# Patient Record
Sex: Female | Born: 1960 | Race: White | Hispanic: No | Marital: Single | Smoking: Never smoker
Health system: Southern US, Academic
[De-identification: ages and names within clinical notes are randomized; demographics above are authoritative.]

## PROBLEM LIST (undated history)

## (undated) DIAGNOSIS — K219 Gastro-esophageal reflux disease without esophagitis: Secondary | ICD-10-CM

## (undated) DIAGNOSIS — E079 Disorder of thyroid, unspecified: Secondary | ICD-10-CM

## (undated) DIAGNOSIS — C801 Malignant (primary) neoplasm, unspecified: Secondary | ICD-10-CM

## (undated) DIAGNOSIS — M8500 Fibrous dysplasia (monostotic), unspecified site: Secondary | ICD-10-CM

## (undated) DIAGNOSIS — R519 Headache, unspecified: Secondary | ICD-10-CM

## (undated) DIAGNOSIS — F32A Depression, unspecified: Secondary | ICD-10-CM

## (undated) DIAGNOSIS — Z85828 Personal history of other malignant neoplasm of skin: Secondary | ICD-10-CM

## (undated) DIAGNOSIS — E785 Hyperlipidemia, unspecified: Secondary | ICD-10-CM

## (undated) DIAGNOSIS — N649 Disorder of breast, unspecified: Secondary | ICD-10-CM

## (undated) HISTORY — DX: Depression, unspecified: F32.A

## (undated) HISTORY — PX: FACIAL COSMETIC SURGERY: SHX629

## (undated) HISTORY — DX: Hyperlipidemia, unspecified: E78.5

## (undated) HISTORY — DX: Headache, unspecified: R51.9

## (undated) HISTORY — PX: HX BREAST BIOPSY: SHX20

## (undated) HISTORY — DX: Disorder of thyroid, unspecified: E07.9

## (undated) HISTORY — DX: Malignant (primary) neoplasm, unspecified (CMS HCC): C80.1

## (undated) HISTORY — DX: Gastro-esophageal reflux disease without esophagitis: K21.9

## (undated) HISTORY — DX: Personal history of other malignant neoplasm of skin: Z85.828

## (undated) HISTORY — PX: HX HYSTERECTOMY: SHX81

## (undated) HISTORY — DX: Fibrous dysplasia (monostotic), unspecified site: M85.00

## (undated) HISTORY — DX: Disorder of breast, unspecified: N64.9

## (undated) NOTE — Result Encounter Note (Signed)
 Formatting of this note might be different from the original.  Please inform pt that her screening mammogram is negative for any suspicious changes or lesions. PLease remind her to come in if she notices any breast changes.     Electronically signed by Ronal Clause, APRN,FNP-BC at 10/19/2023  5:27 PM EDT

## (undated) NOTE — Result Encounter Note (Signed)
Formatting of this note might be different from the original.  HI Dominique Becker,     All of your labs look great including your cholesterol levels which are very well controlled on the low dose of crestor and came down a large amount compared to previous so I want you to continue all of your current medications at this time.    Please let me know if you have any further questions about your results    Sincerely,   Dr. Natividad Brood  Electronically signed by Marjorie Smolder, MD at 01/04/2022  3:07 PM EDT

## (undated) NOTE — Progress Notes (Signed)
Formatting of this note is different from the original.  Subjective   Patient ID: Dominique Becker is a 55 y.o. female who presents for Cough (- Pt presents with cough / congestion, sinus pressure / pain, abd pain, HA, fever X1 week. ) and Fever.    Patient presents with c/o cough, HA, fatigue, fever and somnolence since Saturday. States she has been sleeping all week which is unusual for her. C/o nausea. Patient in no acute distress on exam, resps even and unlabored, pink, warm, dry.     History provided by:  Patient  Cough  This is a new problem. The current episode started in the past 7 days. The problem has been gradually worsening. The problem occurs constantly. The cough is Non-productive. Associated symptoms include chills, a fever, headaches, myalgias, nasal congestion and rhinorrhea. Pertinent negatives include no chest pain, ear pain, hemoptysis, sore throat, shortness of breath or wheezing. Nothing aggravates the symptoms. She has tried OTC cough suppressant and rest for the symptoms. The treatment provided no relief.   Fever   This is a new problem. The current episode started in the past 7 days. The problem occurs 2 to 4 times per day. The problem has been unchanged. The maximum temperature noted was 100 to 100.9 F. The temperature was taken using an oral thermometer. Associated symptoms include congestion, coughing, headaches, muscle aches and nausea. Pertinent negatives include no abdominal pain, chest pain, diarrhea, ear pain, sore throat, vomiting or wheezing. She has tried acetaminophen and NSAIDs for the symptoms. The treatment provided moderate relief.     Review of Systems   Constitutional:  Positive for chills and fever. Negative for activity change, appetite change and fatigue.   HENT:  Positive for congestion, rhinorrhea, sinus pressure, sinus pain and sneezing. Negative for ear pain and sore throat.    Eyes:  Negative for pain.   Respiratory:  Positive for cough and chest tightness. Negative  for hemoptysis, shortness of breath and wheezing.    Cardiovascular:  Negative for chest pain and palpitations.   Gastrointestinal:  Positive for nausea. Negative for abdominal pain, constipation, diarrhea and vomiting.   Musculoskeletal:  Positive for myalgias. Negative for arthralgias and gait problem.   Skin:  Negative for color change.   Neurological:  Positive for headaches. Negative for dizziness, syncope and light-headedness.   Hematological:  Negative for adenopathy.     Objective   Pulse 91   Temp 99.9 F (37.7 C) (Tympanic)   Ht 5\' 4"  (1.626 m)   Wt 247 lb (112 kg)   SpO2 96%   BMI 42.40 kg/m   Physical Exam  Vitals and nursing note reviewed.   Constitutional:       General: She is not in acute distress.     Appearance: Normal appearance. She is ill-appearing.   HENT:      Head: Normocephalic.      Right Ear: Tympanic membrane and ear canal normal.      Left Ear: Tympanic membrane and ear canal normal.      Nose: Nose normal.   Eyes:      Extraocular Movements: Extraocular movements intact.      Pupils: Pupils are equal, round, and reactive to light.   Cardiovascular:      Rate and Rhythm: Normal rate and regular rhythm.      Heart sounds: Normal heart sounds.   Pulmonary:      Effort: Pulmonary effort is normal.      Breath sounds:  Normal breath sounds and air entry. No decreased breath sounds, wheezing, rhonchi or rales.   Chest:      Chest wall: No tenderness.   Abdominal:      General: Bowel sounds are normal.   Musculoskeletal:         General: Normal range of motion.      Cervical back: Full passive range of motion without pain, normal range of motion and neck supple. No rigidity.   Skin:     General: Skin is warm and dry.      Capillary Refill: Capillary refill takes less than 2 seconds.   Neurological:      General: No focal deficit present.      Mental Status: She is alert and oriented to person, place, and time.   Psychiatric:         Attention and Perception: Attention and perception  normal.         Mood and Affect: Mood normal.         Behavior: Behavior is cooperative.     Results for orders placed or performed in visit on 04/14/23   POCT Respiratory Panel manually resulted    Collection Time: 04/14/23 10:33 AM   Result Value Ref Range    Influenza A PCR Positive (A) Negative    Influenza B PCR Negative Negative    POC RSV PCR Negative Negative    SARS-CoV-2 PCR Negative Negative    RHINOVIRUS PCR Negative Negative    Internal QC Passed PASSED      Past Medical History:   Diagnosis Date    Allergic Morphine    Arthritis     Cancer (CMS/HCC) (HCC)     Disease of thyroid gland     GERD (gastroesophageal reflux disease)     History of mammogram 10/13/2020    Note: [facility] [place] BIRADS 2 - benign Repeat in 1 year    History of mammogram 10/14/2021    [facility] BIRADS 2 - benign Repeat in 1 year     Obesity     Varicella     Visual impairment      Family History   Problem Relation Name Age of Onset    Heart attack Father Holley Dexter     Heart disease Father Holley Dexter      Current Outpatient Medications   Medication Sig Dispense Refill    aspirin 81 mg oral EC tablet Take 1 tablet (81 mg) by mouth.      escitalopram 5 mg oral tablet Take 1 tablet (5 mg) by mouth in the morning. 90 tablet 1    famotidine 20 mg oral tablet Take 1 tablet (20 mg) by mouth in the morning and at bedtime. Patient Instructions: 1 po bid as needed 180 tablet 1    omeprazole 20 mg oral DR capsule Take 1 capsule (20 mg) by mouth before breakfast and before evening meal. 180 capsule 1    rosuvastatin 5 mg oral tablet       SYNTHROID 175 mcg oral tablet TAKE 1 TABLET BY MOUTH EVERY MORNING BEFORE BREAKFAST 90 tablet 0    diclofenac sodium 1 % top gel apply 2g over upper extremity joint or 4g over lower extremity joint 2-3 times daily for up to 21 days at a time; do not use on more than 2 body parts at the same time 450 g 1    ezetimibe 10 mg oral tablet Patient Instructions: 1 daily (Patient not taking: Reported on 01/05/2023) 90 tablet  1     nitroglycerin 0.4 mg SL SL tablet        No current facility-administered medications for this visit.     Allergies   Allergen Reactions    Doxycycline     Metronidazole Nausea And Vomiting     Other reaction(s): Nausea, Vomiting, Diarrhea    Morphine Sulfate Nausea And Vomiting     Other reaction(s): Nausea, Vomiting, Diarrhea, headache, Nausea, Vomiting, Diarrhea, headache    Vancomycin Hcl      Other reaction(s): itching     Social History     Socioeconomic History    Marital status: Single     Spouse name: Not on file    Number of children: Not on file    Years of education: Not on file    Highest education level: Not on file   Occupational History    Not on file   Tobacco Use    Smoking status: Never    Smokeless tobacco: Never   Substance and Sexual Activity    Alcohol use: Never    Drug use: Never    Sexual activity: Not on file   Other Topics Concern    Not on file   Social History Narrative    Not on file     Social Drivers of Health     Financial Resource Strain: Low Risk  (06/24/2021)    Overall Financial Resource Strain (CARDIA)     Difficulty of Paying Living Expenses: Not hard at all   Food Insecurity: No Food Insecurity (06/24/2021)    Hunger Vital Sign     Worried About Running Out of Food in the Last Year: Never true     Ran Out of Food in the Last Year: Never true   Transportation Needs: No Transportation Needs (06/24/2021)    PRAPARE - Therapist, art (Medical): No     Lack of Transportation (Non-Medical): No   Physical Activity: Inactive (06/24/2021)    Exercise Vital Sign     Days of Exercise per Week: 0 days     Minutes of Exercise per Session: 0 min   Stress: Not on file   Social Connections: Not on file   Intimate Partner Violence: Not on file   Housing Stability: High Risk (07/06/2021)    Housing Stability Vital Sign     Unable to Pay for Housing in the Last Year: Yes     Number of Places Lived in the Last Year: 1     Unstable Housing in the Last Year: Yes     Patient  Active Problem List   Diagnosis    Chronic sinusitis    Disorder of globe    Diverticular disease    Polyostotic fibrous dysplasia    Hyperlipidemia    Hypothyroidism    Keratoconjunctivitis of both eyes    Rosacea    Colon cancer screening    PVC (premature ventricular contraction)    Anxiety and depression    Gastroesophageal reflux disease without esophagitis    Primary osteoarthritis of right knee    Prediabetes    Allergic rhinitis    History of mammogram    Basal cell adenocarcinoma    Basal cell carcinoma of nose    Chronic cough    Dryness of both eyes due to decreased tear production    Dry eyes, bilateral    Superior limbic keratoconjunctivitis    Dyslipidemia    Fatigue    Floaters, unspecified  laterality    Hypersomnia    Eyelid retraction or lag    Lid retraction    NSVT (nonsustained ventricular tachycardia) (HCC)    Snoring         Assessment/Plan   Visit Diagnoses    Diagnosis ICD-10-CM Order   1. Influenza A with respiratory manifestations  J10.1 oseltamivir 75 mg oral capsule     2. Acute cough  R05.1 POCT Respiratory Panel manually resulted     promethazine-DM 6.25-15 mg/5 mL oral syrup     -Positive for Influenza A- Take medications as prescribed.  Monitor Fluid intake and output.  Fluids frequently.  OTC meds as needed for fever and pain.  Follow up with PCP if no improvement in 5-7 days.       *Voice dictation software was used to generate portions of this note.  If there are any errors or illegible content, please notify me for correction.   Electronically signed by Consuela Mimes, FNP-BC at 04/14/2023 11:01 AM EST

## (undated) NOTE — Progress Notes (Signed)
Formatting of this note is different from the original.  Patient Active Problem List   Diagnosis    Chronic sinusitis    Disorder of globe    Diverticular disease    Polyostotic fibrous dysplasia    Hyperlipidemia    Hypothyroidism    Keratoconjunctivitis of both eyes    Rosacea    Colon cancer screening    PVC (premature ventricular contraction)    Anxiety and depression    Gastroesophageal reflux disease without esophagitis    Primary osteoarthritis of right knee    History of mammogram     Subjective   Patient ID: Dominique Becker is a 6 y.o. female who presents for Hypothyroidism (States that she thinks 131mg of synthroid isn't enough. States she feels bad on lower dose), Hyperlipidemia (States that the cholesterol medication makes her feel bad, has to take it at night), and Cough (States has drainage and cough x2-3 months. Has taken OTC meds without relief. Started on Zyrtec with no relief.)    HPI  Presents for follow-up    Nasal drainage and cough x 2-3 months  Has tried OTC zyrtec and cough/cold medications but sx are persisting   +mild sinus pressure    PVCs:  remains off all medications at this time and continues following regularly with EP/cardiology  cardiology Dr SDelia Chimes(10/2021) -checking stress test and sleep study; recommended to resume crestor '5mg'$  daily   No new s/s noted  holter monitor (01/2020) SVT, PVCs ; >50k PVCs noted    evaluated by EP (03/2020) -TTE (04/2020) wnl; EF 55-60%; wore another monitor and told had further ectopy but recommended continued monitoring unless sx worsen     anxiety and depression:  on lexapro '10mg'$  every day -states taking regularly and sx have been well controlled so stopped taking wellbutrin >1 year ago  Ongoing personal stressors -Father passed away after getting covid several months ago; mom living now [facility] -recently with broken hip s/p surgery and rehab  (initially tried zoloft but couldn't tolerate due to n/v; didn't tolerate prozac  previously)     GERD: on omeprazole '20mg'$  BID and famotidine '20mg'$  BID prn -states taking omeprazole as rx and sx remain well controlled  Only needing famotidine occasionally  past h/o stomach ulcers -multiple several years ago     Hypothyroidism: on synthroid dec to 1531m daily (06/2021) but states has been taking 175 mcg daily for the past few months bc feeling so poorly on the 15040m-states feels better on 175m69maily  Had thyroid levels completed last month by Dr StanDelia Chimes told thyroid levels are normal on the 175mc54mse  +9lb weight gain since (06/2021)    HLD: on zetia '10mg'$  daily  -states taking regularly and tolerating well  Per cardiology Dr StantDelia Chimes023) -resumed crestor '5mg'$  daily as well -states tolerating well  -declines statin bc worried about side effects        Past conditions being followed, not addressed today:  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   follows at [facility]: on lotemax eye gel BID -frequent tearing of eyes   CT sinus (11/2018) stable post op changes  Repeat CT sinus (12/2020) -stable post op changes   ongoing poor vision on L -following with ophthalmology regularly  -continues using OTC antihistamines and nasal saline sprays prn   tessalon helps as well     rosacea: no longer on doxycycline '100mg'$  daily  as broke out in hives  also  on soolantra cream by dermatology Dr Deirdre Pippins    R knee pain on/off the past 2 years -direct fall on knee 2 years ago; never sought care due to [place] at the time   established with ortho (2022) -received injections with improvement  on ibuprofen and voltaren gel prn     abnormal mammogram 08/2016 -probably benign but recommended 33mof/u -repeat mammogram (09/2017) wnl; repeat 1 year   breast lumps removed 03/2017; repeat mammogram 09/2017 benign   repeat mammogram (09/2019) wnl   underwent genetic testing for cancer risk -continues following [facility] breast surgeon [facility]   Repeat mammogram (09/2021) wnl;  repeat 1 year    Current Outpatient Medications:     aspirin 81 mg oral EC tablet, Take 1 tablet (81 mg) by mouth., Disp: , Rfl:     cetirizine 10 mg oral tablet, Take 1 tablet (10 mg) by mouth., Disp: , Rfl:     diclofenac sodium 1 % top gel, apply 2g over upper extremity joint or 4g over lower extremity joint 2-3 times daily for up to 21 days at a time; do not use on more than 2 body parts at the same time, Disp: 100 g, Rfl: 1    escitalopram 10 mg oral tablet, Patient Instructions: 1 tab QD, Disp: 90 tablet, Rfl: 1    ezetimibe 10 mg oral tablet, Patient Instructions: 1 daily, Disp: 90 tablet, Rfl: 1    famotidine 20 mg oral tablet, Patient Instructions: 1 po bid as needed, Disp: 180 tablet, Rfl: 1    fluorometholone 0.1 % opht ophthalmic suspension, , Disp: , Rfl:     nitroglycerin 0.4 mg SL SL tablet, , Disp: , Rfl:     omeprazole 20 mg oral DR capsule, Take 1 capsule (20 mg) by mouth before breakfast and before evening meal., Disp: 180 capsule, Rfl: 1    rosuvastatin 10 mg oral tablet, Take 1 tablet (10 mg) by mouth in the morning., Disp: 90 tablet, Rfl: 1    amoxicillin-pot clavulanate 875-125 mg oral tablet, Take 1 tablet by mouth in the morning and at bedtime for 10 days., Disp: 20 tablet, Rfl: 0    SYNTHROID 175 mcg oral tablet, Take 1 tablet (175 mcg) by mouth before breakfast., Disp: 90 tablet, Rfl: 1    Allergies   Allergen Reactions    Doxycycline     Metronidazole Nausea And Vomiting     Other reaction(s): Nausea, Vomiting, Diarrhea    Morphine Sulfate Nausea And Vomiting     Other reaction(s): Nausea, Vomiting, Diarrhea, headache, Nausea, Vomiting, Diarrhea, headache    Rosuvastatin     Vancomycin Hcl      Other reaction(s): itching     Review of Systems   Constitutional:  Negative for activity change, appetite change and unexpected weight change.   HENT:  Positive for congestion, rhinorrhea and sinus pressure.    Respiratory:  Positive for cough. Negative for shortness of breath and wheezing.     Cardiovascular:  Negative for chest pain and palpitations.   Gastrointestinal:  Negative for abdominal pain.   Neurological:  Negative for dizziness and headaches.   Psychiatric/Behavioral:  Negative for dysphoric mood and sleep disturbance. The patient is not nervous/anxious.      Objective   BP 136/78 (BP Location: Left arm, Patient Position: Sitting, BP Cuff Size: Adult)   Pulse 83   Ht '5\' 4"'$  (1.626 m)   Wt 247 lb (112 kg)   SpO2 96%  BMI 42.40 kg/m     Physical Exam  Vitals reviewed.   Constitutional:       Appearance: Normal appearance.   HENT:      Nose: Nose normal.      Mouth/Throat:      Mouth: Mucous membranes are moist.      Pharynx: Oropharynx is clear.   Eyes:      Conjunctiva/sclera: Conjunctivae normal.      Pupils: Pupils are equal, round, and reactive to light.   Cardiovascular:      Rate and Rhythm: Normal rate and regular rhythm.      Heart sounds: No murmur heard.  Pulmonary:      Effort: Pulmonary effort is normal.      Breath sounds: Normal breath sounds. No wheezing, rhonchi or rales.   Abdominal:      General: Bowel sounds are normal. There is no distension.      Palpations: Abdomen is soft.   Musculoskeletal:         General: Normal range of motion.      Cervical back: Neck supple.   Skin:     General: Skin is warm and dry.   Neurological:      General: No focal deficit present.      Mental Status: She is alert and oriented to person, place, and time.   Psychiatric:         Mood and Affect: Mood normal.         Thought Content: Thought content normal.     Assessment/Plan   1. PVC (premature ventricular contraction)  Stable currently off all medications -encouraged to continue to keep close follow-up with cardiology  -records requested from recent stress test and sleep study    2. Anxiety and depression  Stable, continue current medication    3. Gastroesophageal reflux disease without esophagitis  Stable, continue current medications    4. Hypothyroidism, unspecified type  Stable on  current dose of synthroid 120mg daily -continue  -repeat labs ordered    - TSH  - T4, free  - SYNTHROID 175 mcg oral tablet; Take 1 tablet (175 mcg) by mouth before breakfast.  Dispense: 90 tablet; Refill: 1    5. Hyperlipidemia, unspecified hyperlipidemia type  Stable, continue current medications -tolerate '5mg'$  of crestor currently so will continue at this time (unable to tolerate higher dose); continue zetia as rx  Repeat labs ordered     - Comprehensive metabolic panel  - Lipid panel    6. Other long term (current) drug therapy  Labs ordered  - CBC auto differential    7. Acute non-recurrent sinusitis, unspecified location  Rx sent for augmentin x 10 days  -encouraged to continue symptomatic measures and seek care if sx persist or worsen    - amoxicillin-pot clavulanate 875-125 mg oral tablet; Take 1 tablet by mouth in the morning and at bedtime for 10 days.  Dispense: 20 tablet; Refill: 0    RTC 6 months or advised to return sooner if needed    [place]  Fasting glucose/A1c  repeat ordered  Pap smear reports UTD gyn 09/2018  Mammogram (09/2021) wnl; repeat 1 year  Colonoscopy cologuard neg (06/2020)  DEXA (09/2020) wnl  Tetanus will discuss  Influenza discussed previously-pt declined  Pneumococcal/Prevnar n/a  Shingrix 1st dose (12/2020) -2nd dose given today (06/2021)  COVID 3doses 2021  Hepatitis C screening will discuss  Lung cancer screening n/a  Dietary/Exercise Intervention: will continue  to encourage healthy diet and daily physical activity.   Electronically signed by Marjorie Smolder, MD at 01/04/2022  9:02 AM EDT

## (undated) NOTE — Progress Notes (Signed)
 Formatting of this note is different from the original.  Chief Complaint   Patient presents with    Nausea     Pt. Presents with nausea, vertigo, and weakness- ongoing 1 week. Urine dipstick collected. PT states she had blood work and xray done at [facility] a few days ago. Pt states her friend has flu and she would like to be tested to rule this out. Additionally, pt is concerned she is not getting enough fluids as she is unable to keep anything down.      Subjective     Dominique Becker is a 18 y.o. female who presents with the above chief complaint.    PT WENT TO ER, HAD LABWORK CXR WAS TOLD EVERYTHING WAS GOOD, JUST TO TAKE ZOFRAN, PT IS STILL UNABLE TO KEEP ANYTHING DOWN.     Patient Active Problem List   Diagnosis    Chronic sinusitis    Disorder of globe    Diverticular disease    Polyostotic fibrous dysplasia    Hyperlipidemia    Hypothyroidism    Keratoconjunctivitis of both eyes    Rosacea    Colon cancer screening    PVC (premature ventricular contraction)    Anxiety and depression    Gastroesophageal reflux disease without esophagitis    Primary osteoarthritis of right knee    Prediabetes    Allergic rhinitis    History of mammogram    Basal cell adenocarcinoma    Basal cell carcinoma of nose    Chronic cough    Dryness of both eyes due to decreased tear production    Dry eyes, bilateral    Superior limbic keratoconjunctivitis    Dyslipidemia    Fatigue    Floaters, unspecified laterality    Hypersomnia    Eyelid retraction or lag    Lid retraction    NSVT (nonsustained ventricular tachycardia) (HCC)    Snoring     Past Medical History:   Diagnosis Date    Allergic Morphine    Arthritis     Cancer (CMS/HCC) (HCC)     Disease of thyroid gland     GERD (gastroesophageal reflux disease)     History of mammogram 10/13/2020    Note: [place] BIRADS 2 - benign Repeat in 1 year    History of mammogram 10/14/2021    [place] BIRADS 2 - benign Repeat in 1 year     Obesity     Varicella     Visual impairment      Current  Outpatient Medications:     aspirin 81 mg oral EC tablet, Take 1 tablet (81 mg) by mouth., Disp: , Rfl:     diclofenac sodium 1 % top gel, apply 2g over upper extremity joint or 4g over lower extremity joint 2-3 times daily for up to 21 days at a time; do not use on more than 2 body parts at the same time, Disp: 450 g, Rfl: 1    escitalopram 5 mg oral tablet, Take 1 tablet (5 mg) by mouth in the morning., Disp: 90 tablet, Rfl: 1    ezetimibe 10 mg oral tablet, Patient Instructions: 1 daily (Patient not taking: Reported on 01/05/2023), Disp: 90 tablet, Rfl: 1    famotidine 20 mg oral tablet, Take 1 tablet (20 mg) by mouth in the morning and at bedtime. Patient Instructions: 1 po bid as needed, Disp: 180 tablet, Rfl: 1    nitroglycerin 0.4 mg SL SL tablet, , Disp: , Rfl:  omeprazole 20 mg oral DR capsule, Take 1 capsule (20 mg) by mouth before breakfast and before evening meal., Disp: 180 capsule, Rfl: 1    promethazine-DM 6.25-15 mg/5 mL oral syrup, Take 5 mL by mouth every 4 (four) hours if needed for cough for up to 7 days., Disp: 118 mL, Rfl: 0    rosuvastatin 5 mg oral tablet, , Disp: , Rfl:     SYNTHROID 175 mcg oral tablet, TAKE 1 TABLET BY MOUTH EVERY MORNING BEFORE BREAKFAST, Disp: 90 tablet, Rfl: 0    Allergies:   Allergies   Allergen Reactions    Doxycycline     Metronidazole Nausea And Vomiting     Other reaction(s): Nausea, Vomiting, Diarrhea    Morphine Sulfate Nausea And Vomiting     Other reaction(s): Nausea, Vomiting, Diarrhea, headache, Nausea, Vomiting, Diarrhea, headache    Vancomycin Hcl      Other reaction(s): itching     Review of Systems   Constitutional:  Negative for activity change, appetite change, chills, fatigue and fever.   Eyes:  Negative for pain.   Respiratory:  Negative for shortness of breath and wheezing.    Cardiovascular:  Negative for chest pain and palpitations.   Gastrointestinal:  Positive for nausea and vomiting. Negative for abdominal pain, constipation and diarrhea.    Musculoskeletal:  Negative for arthralgias, gait problem and myalgias.   Skin:  Negative for color change.   Neurological:  Positive for weakness. Negative for dizziness, syncope, light-headedness and headaches.   Hematological:  Negative for adenopathy.       Objective   Visit Vitals  BP 124/84   Pulse 82   Temp 98.2 F (36.8 C)   Wt 242 lb 12.8 oz (110 kg)   SpO2 98%   BMI 41.68 kg/m   Smoking Status Never   BSA 2.23 m     Physical Exam  Vitals and nursing note reviewed.   Constitutional:       Appearance: Normal appearance.   HENT:      Head: Normocephalic.      Right Ear: Tympanic membrane and ear canal normal.      Left Ear: Tympanic membrane and ear canal normal.      Nose: Nose normal.   Eyes:      Extraocular Movements: Extraocular movements intact.      Pupils: Pupils are equal, round, and reactive to light.   Cardiovascular:      Rate and Rhythm: Normal rate and regular rhythm.      Heart sounds: Normal heart sounds.   Pulmonary:      Effort: Pulmonary effort is normal.      Breath sounds: Normal breath sounds and air entry.   Abdominal:      General: Bowel sounds are normal.      Tenderness: There is abdominal tenderness. There is no right CVA tenderness, left CVA tenderness, guarding or rebound.   Musculoskeletal:         General: Normal range of motion.      Cervical back: Full passive range of motion without pain, normal range of motion and neck supple.   Skin:     General: Skin is warm and dry.      Capillary Refill: Capillary refill takes less than 2 seconds.   Neurological:      General: No focal deficit present.      Mental Status: She is alert and oriented to person, place, and time.   Psychiatric:  Attention and Perception: Attention and perception normal.         Mood and Affect: Mood normal.         Behavior: Behavior is cooperative.     Results for orders placed or performed in visit on 04/20/23   POCT Urinalysis Dipstick manually resulted    Collection Time: 04/20/23 12:01 PM    Result Value Ref Range    Color, UA Yellow Colorless, Yellow, Straw, Xanthochromic, Light Yellow    Clarity, UA Clear Clear    Glucose, UA Negative Negative    Bilirubin, UA Negative Negative    Ketones, UA Negative Negative    Spec Grav, UA 1.020 1.005 - 1.025    Blood, UA Negative Negative    pH, UA 7.0 5.0 - 7.5    Protein, UA Negative Negative    Urobilinogen, UA 1.0 0.2, 1.0 eu/dL    Nitrite, UA Negative Negative    Leukocytes, UA Negative Negative   POCT Respiratory Panel manually resulted    Collection Time: 04/20/23 12:17 PM   Result Value Ref Range    Influenza A PCR Negative Negative    Influenza B PCR Negative Negative    POC RSV PCR Negative Negative    SARS-CoV-2 PCR Negative Negative    RHINOVIRUS PCR Negative Negative    Internal QC Passed PASSED      Assessment/Plan   Visit Diagnoses    Diagnosis ICD-10-CM Order   1. Nausea and vomiting, unspecified vomiting type  R11.2 POCT Urinalysis Dipstick manually resulted     promethazine (Phenergan) injection 12.5 mg     promethazine 25 mg rect suppository     CANCELED: UA Dipstick     2. Acute cough  R05.1 POCT Respiratory Panel manually resulted       TAKE MEDICATION AS PRESCRIBED. LIGHT SMALL MEALS, INCREASE FLUIDS. IF NO IMPROVEMENT IN 2-3 DAYS MAY RETURN TO CLINIC OR GO TO ER       Electronically signed by Oda Cogan, FNP-C at 04/20/2023 12:18 PM EST

## (undated) NOTE — Progress Notes (Signed)
Formatting of this note might be different from the original.  Patient identified via [place] for med-adherence at-risk regarding Rosuvastatin 10 mg. Patient reports medication made her very ill and she self-stopped this medication. *If clinically appropriate please consider alternative statin therapy for CVD risk reduction such as Pravastatin 80 mg daily.   Electronically signed by Zorita Pang, PharmD at 11/19/2021  2:22 PM EDT

## (undated) NOTE — Result Encounter Note (Signed)
Formatting of this note might be different from the original.  Please update caregap reminder    Electronically signed by Berlin Hun, MD at 11/09/2022 12:25 PM EDT

## (undated) NOTE — Progress Notes (Signed)
Formatting of this note might be different from the original.  Spoke with pt.  States she had cardiac testing that did "not go well".  Yesterday.  Cardiologist would like her to continue on the Crestor at 5 mg. At this time.  Pt states she will continue this for two weeks and should she still notice side effects, will contact our office to change to rosuvastatin.  Will contact us in two weeks.  Electronically signed by Shelle Iron, LPN at 48/18/5909  3:11 AM EDT

---

## 2016-09-21 DIAGNOSIS — R922 Inconclusive mammogram: Secondary | ICD-10-CM

## 2017-03-28 DIAGNOSIS — R922 Inconclusive mammogram: Secondary | ICD-10-CM

## 2017-03-28 DIAGNOSIS — R928 Other abnormal and inconclusive findings on diagnostic imaging of breast: Secondary | ICD-10-CM

## 2017-09-16 ENCOUNTER — Encounter (INDEPENDENT_AMBULATORY_CARE_PROVIDER_SITE_OTHER): Payer: Self-pay

## 2017-09-21 ENCOUNTER — Ambulatory Visit (INDEPENDENT_AMBULATORY_CARE_PROVIDER_SITE_OTHER): Payer: Self-pay

## 2017-09-22 ENCOUNTER — Encounter (INDEPENDENT_AMBULATORY_CARE_PROVIDER_SITE_OTHER): Payer: Self-pay

## 2017-09-22 ENCOUNTER — Ambulatory Visit (INDEPENDENT_AMBULATORY_CARE_PROVIDER_SITE_OTHER): Payer: Medicare Other

## 2017-09-22 VITALS — BP 132/74 | Ht 64.0 in | Wt 246.0 lb

## 2017-09-22 DIAGNOSIS — Z6841 Body Mass Index (BMI) 40.0 and over, adult: Secondary | ICD-10-CM

## 2017-09-22 DIAGNOSIS — R928 Other abnormal and inconclusive findings on diagnostic imaging of breast: Secondary | ICD-10-CM

## 2017-09-22 DIAGNOSIS — Z803 Family history of malignant neoplasm of breast: Secondary | ICD-10-CM

## 2017-09-22 DIAGNOSIS — Z01419 Encounter for gynecological examination (general) (routine) without abnormal findings: Secondary | ICD-10-CM

## 2017-09-22 MED ORDER — ESTRADIOL 0.01% (0.1 MG/GRAM) VAGINAL CREAM
2.00 g | TOPICAL_CREAM | VAGINAL | 11 refills | Status: DC
Start: 2017-09-22 — End: 2018-10-02

## 2017-09-22 NOTE — Progress Notes (Signed)
[place] [facility]  [facility]  [place] [address]  [phone]        Patient name:  Dominique Becker  MRN:  [id number]  DOB:  [birth year]  DATE:  09/22/2017      Ludwig Clarks Fauth  57 y.o. G0P0 who presents as a new pt for annual exam. She had a TAH/BSO in 2004 due to endometriosis. She denies any significant symptoms of menopause.  She is not sexually active. She has had some vaginal irritation that she describes as burning and itching.  She thought that it was due to a yeast infection so she treated with OTC yeast creams.  Her symptoms lasted for about 1 month but have now completely resolved.       She says that she has never and an abnormal pap.      Her last mammogram was last fall.  She says that she had a mammogram that "showed 2 places" about a year ago.  A follow up study 6 months later showed that those places had resolved but she had a new spot that was concerning.  She had a biopsy per Dr. Ronda Fairly that was benign.  She is supposed to go back next month for another u/s.      Her last colonoscopy was 06/2017.    Past Surgical History:   Procedure Laterality Date   . FACIAL COSMETIC SURGERY      x25   . HX BREAST BIOPSY     . HX HYSTERECTOMY         Past Medical History:   Diagnosis Date   . Breast disorder    . Cancer (CMS HCC)     skin cancer on nose   . Disorder of thyroid    . Headache        Current Outpatient Medications   Medication Sig Dispense Refill   . ezetimibe (ZETIA ORAL) Take by mouth     . levothyroxine (SYNTHROID) 175 mcg Oral Tablet Take 175 mcg by mouth Every morning       No current facility-administered medications for this visit.      Allergies   Allergen Reactions   . Morphine Nausea/ Vomiting     Family Medical History:     Problem Relation (Age of Onset)    Breast Cancer Mother    Heart Disease Father          Social History     Socioeconomic History   . Marital status: Single     Spouse name: Not on file   . Number of children:  Not on file   . Years of education: Not on file   . Highest education level: Not on file   Occupational History   . Not on file   Social Needs   . Financial resource strain: Not on file   . Food insecurity:     Worry: Not on file     Inability: Not on file   . Transportation needs:     Medical: Not on file     Non-medical: Not on file   Tobacco Use   . Smoking status: Never Smoker   . Smokeless tobacco: Never Used   Substance and Sexual Activity   . Alcohol use: Never     Frequency: Never   . Drug use: Not on file   . Sexual activity: Not Currently   Lifestyle   . Physical activity:     Days per  week: Not on file     Minutes per session: Not on file   . Stress: Not on file   Relationships   . Social connections:     Talks on phone: Not on file     Gets together: Not on file     Attends religious service: Not on file     Active member of club or organization: Not on file     Attends meetings of clubs or organizations: Not on file     Relationship status: Not on file   . Intimate partner violence:     Fear of current or ex partner: Not on file     Emotionally abused: Not on file     Physically abused: Not on file     Forced sexual activity: Not on file   Other Topics Concern   . Not on file   Social History Narrative   . Not on file     Review of Systems  Constitutional: Good general healthy lately.  Negative for fever, recent weight change.  Eyes, ears, nose, throat: Negative for glaucoma, blurred or double vision, sinus problems, earaches and drainage, mouth sores, swollen glands in neck, sore throat, difficulty swallowing, hearing problems.  Cardiovascular:  Negative for heart trouble/heart attack, chest pain or irregular heart beat, high blood pressure, high cholesterol, blood clots.  Respiratory: Negative for cough, shortness of breath, asthma, or wheezing.  Gastrointestinal: Negative for loss of appetite, change in bowel movement, blood in stool, stomach ulcer, abdominal pain, liver disease/problems.      Genitourinary:  Negative for frequent urination, burning or painful urination, blood in urine, leakage or dribbling, vaginal discharge, sexual difficulty, history of sexually transmitted diseases. Menstrual cycle as described in the HPI.   Endocrine:  Negative for excessive thirst or urination, heat or cold intolerance.  Integument/breast: Negative for rash or itching, breast pain, breast discharge or lump.  Neurological:  Negative for convulsions or seizures, tremors, and stroke/head injury.  Musculoskeletal:  Negative for history of deep blood clots and difficulty with walking.  Hematologic/lymphatic: negative for easy bruising, bleeding, lymphadenopathy.    Objective:      BP 132/74   Ht 1.626 m (5\' 4" )   Wt 111.6 kg (246 lb)   BMI 42.23 kg/m     General:  Alert, cooperative, no distress, appears stated age.   Head:  Normocephalic, without obvious abnormality, atraumatic.   Eyes:  Conjunctivae/corneas clear. PERRL, EOMs intact. Sclera - non icteric   Ears:  External ear WNL.   Nose: Nares normal. Septum midline. Mucosa normal. No drainage or sinus tenderness.   Throat: Lips, mucosa, and tongue normal. Teeth and gums normal.   Neck: Supple, symmetrical, trachea midline, no adenopathy, thyroid: no enlargment/tenderness/nodules   Lungs:   Clear to auscultation bilaterally.   Chest wall:  No tenderness or deformity.   Heart:  Regular rate and rhythm, S1, S2 normal, no murmur, click, rub or gallop.   Breast Exam:  No tenderness, masses, or nipple abnormality.   Abdomen:   Soft, non-tender. Bowel sounds normal. No masses,  No organomegaly.   Genitalia:  Normal female without lesion,scarring or rashes.  Vagina - pale pink with mild atrophic changes.  She has no lesions or abnormal discharge.  Uterus, cervix and ovaries are surgically absent.     Extremities: Extremities normal, atraumatic, no cyanosis or edema.   Skin: Skin color, texture, turgor normal. No rashes or lesions.   Lymph nodes: Cervical,  supraclavicular, and axillary  nodes normal.   Neurologic: Grossly intact.        Assessment:     Healthy female exam.   Atrophic vaginitis.    Family history of breast cancer (mother dx'd in late 52's)    Plan:     Pt no longer needs to have pap smears.   Continue evaluation of breasts as per Dr. Ronda Fairly.   Try vaginal estrogen cream.    Check MyRisk panel.       [name], MD  09/22/2017, 12:04

## 2017-09-29 ENCOUNTER — Encounter (INDEPENDENT_AMBULATORY_CARE_PROVIDER_SITE_OTHER): Payer: Self-pay

## 2017-11-18 ENCOUNTER — Ambulatory Visit (INDEPENDENT_AMBULATORY_CARE_PROVIDER_SITE_OTHER): Payer: Self-pay

## 2017-12-26 ENCOUNTER — Encounter (INDEPENDENT_AMBULATORY_CARE_PROVIDER_SITE_OTHER): Payer: Self-pay | Admitting: Obstetrics & Gynecology

## 2018-10-02 ENCOUNTER — Encounter (INDEPENDENT_AMBULATORY_CARE_PROVIDER_SITE_OTHER): Payer: Self-pay

## 2018-10-02 ENCOUNTER — Ambulatory Visit (INDEPENDENT_AMBULATORY_CARE_PROVIDER_SITE_OTHER): Payer: Medicare Other

## 2018-10-02 ENCOUNTER — Other Ambulatory Visit: Payer: Self-pay

## 2018-10-02 VITALS — BP 134/80 | Ht 64.0 in | Wt 241.0 lb

## 2018-10-02 DIAGNOSIS — Z1231 Encounter for screening mammogram for malignant neoplasm of breast: Secondary | ICD-10-CM

## 2018-10-02 DIAGNOSIS — Z6841 Body Mass Index (BMI) 40.0 and over, adult: Secondary | ICD-10-CM

## 2018-10-02 DIAGNOSIS — Z01419 Encounter for gynecological examination (general) (routine) without abnormal findings: Secondary | ICD-10-CM

## 2018-10-02 DIAGNOSIS — Z803 Family history of malignant neoplasm of breast: Secondary | ICD-10-CM

## 2018-10-02 NOTE — Patient Instructions (Signed)
Call for any problems.

## 2018-10-02 NOTE — Progress Notes (Signed)
[place] [address]  [phone]        Patient name:  Dominique Becker  MRN:  [id number]  DOB:  July 22, 1960  DATE:  10/02/2018      Ludwig Clarks Tissue  58 y.o. G0P0 who presents for annual exam. She has no GYN  complaints today. She had a TAH/BSO many years ago due to endometriosis.  She denies any significant vasomotor symptoms.  She has a family history of breast cancer.  Since her last visit, she has had testing for genetic mutations that would increase her risk of breast cancer. She is not currently sexually active.      Her last pap was prior to seeing me.  Her last mammogram was 2019.  Her last colonoscopy was n/a.  She has cologard testing last year that was negative.      Past Surgical History:   Procedure Laterality Date   . FACIAL COSMETIC SURGERY      x25   . HX BREAST BIOPSY     . HX HYSTERECTOMY           Past Medical History:   Diagnosis Date   . Breast disorder    . Cancer (CMS HCC)     skin cancer on nose   . Disorder of thyroid    . Headache          Current Outpatient Medications   Medication Sig Dispense Refill   . ezetimibe (ZETIA ORAL) Take by mouth     . levothyroxine (SYNTHROID) 175 mcg Oral Tablet Take 175 mcg by mouth Every morning       No current facility-administered medications for this visit.      Allergies   Allergen Reactions   . Morphine Nausea/ Vomiting     Family Medical History:     Problem Relation (Age of Onset)    Breast Cancer Mother    Heart Disease Father            Social History     Socioeconomic History   . Marital status: Single     Spouse name: Not on file   . Number of children: Not on file   . Years of education: Not on file   . Highest education level: Not on file   Occupational History   . Not on file   Social Needs   . Financial resource strain: Not on file   . Food insecurity     Worry: Not on file     Inability: Not on file   . Transportation needs     Medical: Not on file      Non-medical: Not on file   Tobacco Use   . Smoking status: Never Smoker   . Smokeless tobacco: Never Used   Substance and Sexual Activity   . Alcohol use: Never     Frequency: Never   . Drug use: Not on file   . Sexual activity: Not Currently   Lifestyle   . Physical activity     Days per week: Not on file     Minutes per session: Not on file   . Stress: Not on file   Relationships   . Social Product manager on phone: Not on file     Gets together: Not on file     Attends religious service: Not on file     Active member of club or  organization: Not on file     Attends meetings of clubs or organizations: Not on file     Relationship status: Not on file   . Intimate partner violence     Fear of current or ex partner: Not on file     Emotionally abused: Not on file     Physically abused: Not on file     Forced sexual activity: Not on file   Other Topics Concern   . Not on file   Social History Narrative   . Not on file     Review of Systems  All systems negative except listed in HPI.    Objective:      BP 134/80   Ht 1.626 m (5\' 4" )   Wt 109 kg (241 lb)   BMI 41.37 kg/m       General:  Alert, cooperative, no distress, appears stated age.   Head:  Normocephalic, without obvious abnormality, atraumatic.   Eyes:  Conjunctivae/corneas clear. PERRL, EOMs intact. Sclera - non icteric   Ears:  External ear WNL.   Nose: Nares normal. Septum midline. Mucosa normal. No drainage or sinus tenderness.   Throat: Lips, mucosa, and tongue normal. Teeth and gums normal.   Neck: Supple, symmetrical, trachea midline, no adenopathy, thyroid: no enlargment/tenderness/nodules   Lungs:   Clear to auscultation bilaterally.   Chest wall:  No tenderness or deformity.   Heart:  Regular rate and rhythm, S1, S2 normal, no murmur, click, rub or gallop.   Breast Exam:  No tenderness, masses, or nipple abnormality.   Abdomen:   Soft, non-tender. Bowel sounds normal. No masses,  No organomegaly.   Genitalia:  Normal female without  lesion,scarring or rashes.  Vagina - pink without any lesions or abnormal discharge.  Uterus, cervix and ovaries are surgically absent.    Extremities: Extremities normal, atraumatic, no cyanosis or edema.   Skin: Skin color, texture, turgor normal. No rashes or lesions.   Lymph nodes: Cervical, supraclavicular, and axillary nodes normal.   Neurologic: Grossly intact.        Assessment:     Healthy female exam.   Family h/o breast cancer (mother).  Genetic screening was negative.     Plan:     Pt no longer needs to have pap smears.   Continue annual mammograms.        [name], MD  10/02/2018, 12:02

## 2018-11-06 DIAGNOSIS — N6323 Unspecified lump in the left breast, lower outer quadrant: Secondary | ICD-10-CM

## 2018-11-06 DIAGNOSIS — N6324 Unspecified lump in the left breast, lower inner quadrant: Secondary | ICD-10-CM

## 2018-11-06 DIAGNOSIS — Z803 Family history of malignant neoplasm of breast: Secondary | ICD-10-CM

## 2019-10-04 ENCOUNTER — Other Ambulatory Visit: Payer: Self-pay

## 2019-10-04 ENCOUNTER — Ambulatory Visit (INDEPENDENT_AMBULATORY_CARE_PROVIDER_SITE_OTHER): Payer: Medicare Other

## 2019-10-04 ENCOUNTER — Encounter (INDEPENDENT_AMBULATORY_CARE_PROVIDER_SITE_OTHER): Payer: Self-pay

## 2019-10-04 VITALS — BP 124/82 | Ht 64.0 in | Wt 252.0 lb

## 2019-10-04 DIAGNOSIS — Z6841 Body Mass Index (BMI) 40.0 and over, adult: Secondary | ICD-10-CM

## 2019-10-04 DIAGNOSIS — Z1231 Encounter for screening mammogram for malignant neoplasm of breast: Secondary | ICD-10-CM

## 2019-10-04 DIAGNOSIS — Z01419 Encounter for gynecological examination (general) (routine) without abnormal findings: Secondary | ICD-10-CM

## 2019-10-04 NOTE — Patient Instructions (Signed)
Call for any problems.

## 2019-10-04 NOTE — Progress Notes (Signed)
[name] [address]  [phone]        Patient name:  Dominique Becker  MRN:  [id number]  DOB:  1960-10-27  DATE:  10/04/2019      Dominique Becker  59 y.o. G0P0 who presents for annual exam. She has no GYN  complaints today. She had a TAH/BSO due to endometriosis many years ago.  She is not on HRT and denies significant symptoms of menopause.      She reports that her mother has advanced dementia and they are moving her to a nursing home later today. Pt is feeling a lot of stress/sadness about having to do this and about causing her father distress. (her parents have been together for 75 years.)     Her last mammogram was 09/2018.  Her last colonoscopy was n/a.  Pt had Cologard 2 years ago which was negative.     Past Surgical History:   Procedure Laterality Date   . FACIAL COSMETIC SURGERY      x25   . HX BREAST BIOPSY     . HX HYSTERECTOMY           Past Medical History:   Diagnosis Date   . Breast disorder    . Cancer (CMS HCC)     skin cancer on nose   . Disorder of thyroid    . Headache          Current Outpatient Medications   Medication Sig Dispense Refill   . ezetimibe (ZETIA ORAL) Take by mouth     . levothyroxine (SYNTHROID) 175 mcg Oral Tablet Take 175 mcg by mouth Every morning     . omeprazole (PRILOSEC) 20 mg Oral Capsule, Delayed Release(E.C.)        No current facility-administered medications for this visit.     Allergies   Allergen Reactions   . Morphine Nausea/ Vomiting     Family Medical History:     Problem Relation (Age of Onset)    Breast Cancer Mother    Heart Disease Father            Social History     Socioeconomic History   . Marital status: Single     Spouse name: Not on file   . Number of children: Not on file   . Years of education: Not on file   . Highest education level: Not on file   Occupational History   . Not on file   Tobacco Use   . Smoking status: Never Smoker   . Smokeless tobacco: Never  Used   Vaping Use   . Vaping Use: Never used   Substance and Sexual Activity   . Alcohol use: Never   . Drug use: Never   . Sexual activity: Not Currently   Other Topics Concern   . Not on file   Social History Narrative   . Not on file     Social Determinants of Health     Financial Resource Strain:    . Difficulty of Paying Living Expenses:    Food Insecurity:    . Worried About Charity fundraiser in the Last Year:    . Arboriculturist in the Last Year:    Transportation Needs:    . Film/video editor (Medical):    Marland Kitchen Lack of Transportation (Non-Medical):    Physical Activity:    . Days of Exercise per  Week:    . Minutes of Exercise per Session:    Stress:    . Feeling of Stress :    Intimate Partner Violence:    . Fear of Current or Ex-Partner:    . Emotionally Abused:    Marland Kitchen Physically Abused:    . Sexually Abused:      Review of Systems  All systems negative except listed in HPI.    Objective:      BP 124/82   Ht 1.626 m (5\' 4" )   Wt 114 kg (252 lb)   LMP  (Exact Date)   Breastfeeding No   BMI 43.26 kg/m       General:  Alert, cooperative, no distress, appears stated age.   Head:  Normocephalic, without obvious abnormality, atraumatic.   Eyes:  Conjunctivae/corneas clear. PERRL, EOMs intact. Sclera - non icteric   Ears:  External ear WNL.   Nose: Nares normal. Septum midline. Mucosa normal. No drainage or sinus tenderness.   Throat: Lips, mucosa, and tongue normal. Teeth and gums normal.   Neck: Supple, symmetrical, trachea midline, no adenopathy, thyroid: no enlargment/tenderness/nodules   Lungs:   Clear to auscultation bilaterally.   Chest wall:  No tenderness or deformity.   Heart:  Regular rate and rhythm, S1, S2 normal, no murmur, click, rub or gallop.   Breast Exam:  No tenderness, masses, or nipple abnormality.   Abdomen:   Soft, non-tender. Bowel sounds normal. No masses,  No organomegaly.   Genitalia:  Normal female without lesion,scarring or rashes.  Vagina - pale pink without any lesions or  abnormal discharge.  Uterus, cervix and ovaries are surgically absent.    Extremities: Extremities normal, atraumatic, no cyanosis or edema.   Skin: Skin color, texture, turgor normal. No rashes or lesions.   Lymph nodes: Cervical, supraclavicular, and axillary nodes normal.   Neurologic: Grossly intact.        Assessment:     Healthy female exam.   S/p TAH/BSO  Menopause.     Plan:     Pt no longer needs pap smears  Continue annual mammograms.    cologard next year      [name], MD  10/04/2019, 09:15

## 2019-10-18 ENCOUNTER — Encounter (INDEPENDENT_AMBULATORY_CARE_PROVIDER_SITE_OTHER): Payer: Self-pay

## 2020-08-05 LAB — LAB: COLOGUARD RESULT: NEGATIVE

## 2020-08-05 LAB — COLOGUARD® COLON CANCER SCREEN: COLOGUARD RESULT: NEGATIVE

## 2020-10-07 ENCOUNTER — Encounter (INDEPENDENT_AMBULATORY_CARE_PROVIDER_SITE_OTHER): Payer: Self-pay

## 2021-11-16 ENCOUNTER — Encounter (INDEPENDENT_AMBULATORY_CARE_PROVIDER_SITE_OTHER): Payer: Self-pay | Admitting: Interventional Cardiology

## 2021-11-16 ENCOUNTER — Other Ambulatory Visit: Payer: Self-pay

## 2021-11-16 ENCOUNTER — Ambulatory Visit (INDEPENDENT_AMBULATORY_CARE_PROVIDER_SITE_OTHER): Payer: Medicare Other | Admitting: Interventional Cardiology

## 2021-11-16 VITALS — BP 128/80 | HR 72 | Ht 64.0 in | Wt 245.0 lb

## 2021-11-16 DIAGNOSIS — Z131 Encounter for screening for diabetes mellitus: Secondary | ICD-10-CM

## 2021-11-16 DIAGNOSIS — R079 Chest pain, unspecified: Secondary | ICD-10-CM

## 2021-11-16 DIAGNOSIS — E039 Hypothyroidism, unspecified: Secondary | ICD-10-CM

## 2021-11-16 DIAGNOSIS — M8500 Fibrous dysplasia (monostotic), unspecified site: Secondary | ICD-10-CM

## 2021-11-16 DIAGNOSIS — R002 Palpitations: Secondary | ICD-10-CM

## 2021-11-16 DIAGNOSIS — E785 Hyperlipidemia, unspecified: Secondary | ICD-10-CM

## 2021-11-16 DIAGNOSIS — R0683 Snoring: Secondary | ICD-10-CM

## 2021-11-16 DIAGNOSIS — M839 Adult osteomalacia, unspecified: Secondary | ICD-10-CM

## 2021-11-16 MED ORDER — ROSUVASTATIN 5 MG TABLET
5.0000 mg | ORAL_TABLET | Freq: Every evening | ORAL | 4 refills | Status: DC
Start: 2021-11-16 — End: 2022-11-26

## 2021-11-16 MED ORDER — NITROGLYCERIN 0.4 MG SUBLINGUAL TABLET
0.4000 mg | SUBLINGUAL_TABLET | SUBLINGUAL | 3 refills | Status: DC | PRN
Start: 2021-11-16 — End: 2022-04-27

## 2021-11-16 NOTE — Procedures (Signed)
[place], [place]  [address]  [place] [address]  [facility]  Procedure Note    Name: Laritza Vokes MRN:  [id number]   Date: 11/16/2021 Age: 61 y.o.       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      Francella Solian, MD

## 2021-11-16 NOTE — Progress Notes (Addendum)
Here also has gone up in the probably adjust anything probably an probably just watching with hand I do not think I would rather air coffee    Cardiology [place], [facility]  [address] [place] [address]  [phone]    Cardiology  Clinic Note    Name: Dominique Becker   DOB: 1960/09/03  [61 y.o. female]   MRN: [id number]       Visit Date: 11/16/2021   Referring: No referring provider defined for this encounter.   PCP: Dominique Smolder, MD         Chief Complaint: New Patient and Heart Murmur      History of Present Illness   Dominique Becker is a 61 y.o.  evaluated today for chest discomfort and significant cardiac risk factors.      She has a history of having an irregular heartbeat with evaluated in March of this year by Dr. Stephanie Becker.  She had frequent PVCs and 2 3 beat runs NSVT.  They did not feel further workup was indicated reportedly.      She has been having episodes of discomfort in her upper chest radiating into her neck which she relates to anxiety rather than exertion.  She does have decreased activity tolerance.    She has had no syncope or near syncope.      She has no history of infarction heart failure or valvular heart disease.      Cardiac risk include a strongly positive family history involving her father has severe diffuse disease.  She has history of hyperlipidemia no history of significant hypertension or diabetes and she has never smoked.    Allergies  Allergies   Allergen Reactions    Crestor [Rosuvastatin]     Doxycycline     Morphine Nausea/ Vomiting       History  Past Medical History:   Diagnosis Date    Bone fibrous dysplasia     Depression     Disorder of thyroid     Dyslipidemia     GERD (gastroesophageal reflux disease)     Headache     History of skin cancer          Social History     Socioeconomic History    Marital status: Single   Tobacco Use    Smoking status: Never    Smokeless tobacco: Never   Vaping Use     Vaping Use: Never used   Substance and Sexual Activity    Alcohol use: Never    Drug use: Never    Sexual activity: Not Currently     Family Medical History:       Problem Relation (Age of Onset)    Aortic stenosis Father    Breast Cancer Mother    Cancer Mother    Heart Attack Father    Heart Disease Father    High Cholesterol Father            Past Surgical History:   Procedure Laterality Date    FACIAL COSMETIC SURGERY      x25    HX BREAST BIOPSY      HX HYSTERECTOMY         Medications    Current Outpatient Medications:     aspirin (ECOTRIN) 81 mg Oral Tablet, Delayed Release (E.C.), Take 1 Tablet (81 mg total) by mouth Once per day as needed for Pain, Disp: , Rfl:  cetirizine (ZYRTEC) 10 mg Oral Tablet, Take 1 Tablet (10 mg total) by mouth Once a day, Disp: , Rfl:     escitalopram oxalate (LEXAPRO) 10 mg Oral Tablet, Take 1 Tablet (10 mg total) by mouth Once a day, Disp: , Rfl:     ezetimibe (ZETIA) 10 mg Oral Tablet, Take 1 Tablet (10 mg total) by mouth Once a day, Disp: , Rfl:     levothyroxine (SYNTHROID) 175 mcg Oral Tablet, Take 1 Tablet (175 mcg total) by mouth Every morning, Disp: , Rfl:     multivitamin Oral Capsule, Take 1 Capsule by mouth Once a day, Disp: , Rfl:     omeprazole (PRILOSEC) 20 mg Oral Capsule, Delayed Release(E.C.), , Disp: , Rfl:     Review of Systems:  Review of Systems   Constitutional: Negative for decreased appetite, fever, weight gain and weight loss.   HENT:  Negative for hearing loss.    Eyes:  Negative for vision loss in left eye, vision loss in right eye and visual disturbance.   Cardiovascular:  Positive for palpitations. Negative for chest pain, dyspnea on exertion, irregular heartbeat, orthopnea, paroxysmal nocturnal dyspnea and syncope.   Respiratory:  Negative for cough, shortness of breath, sleep disturbances due to breathing, snoring and wheezing.    Endocrine: Negative for cold intolerance and heat intolerance.   Hematologic/Lymphatic: Negative for bleeding  problem. Does not bruise/bleed easily.   Musculoskeletal:  Negative for back pain, joint pain and joint swelling.   Gastrointestinal:  Negative for anorexia, change in bowel habit, constipation, diarrhea and nausea.   Neurological:  Negative for dizziness and headaches.   Psychiatric/Behavioral:  Negative for depression.      Vital Signs:  BP 128/80   Pulse 72   Ht 1.626 m ('5\' 4"'$ )   Wt 111 kg (245 lb)   SpO2 97%   BMI 42.05 kg/m         Physical Exam:  Physical Exam  Constitutional:       Appearance: Normal appearance.   Neck:      Vascular: No carotid bruit.   Cardiovascular:      Rate and Rhythm: Normal rate and regular rhythm.      Pulses: Normal pulses.      Heart sounds: Normal heart sounds. No murmur heard.  Pulmonary:      Effort: Pulmonary effort is normal.      Breath sounds: Normal breath sounds.   Abdominal:      General: Bowel sounds are normal.      Palpations: Abdomen is soft.   Musculoskeletal:      Right lower leg: No edema.      Left lower leg: No edema.   Neurological:      General: No focal deficit present.      Mental Status: She is alert and oriented to person, place, and time.   Psychiatric:         Behavior: Behavior normal.       Laboratory:  Lipid panel 11/16/2021: Total cholesterol 196, HDL 48, triglycerides 181, LDL 111, non HDL 148, hemoglobin A1c 5.7%.    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    There are no discontinued medications.    Assessment:  1. Chest symptoms radiating into her neck suspicious for coronary disease in a patient with significant cardiac risk factors.    2. Palpitations and frequent VPCs on previous monitoring.  3. Mild-to-moderate hyperlipidemia   4. Symptoms suspicious for possible sleep apnea.    5. 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  6. History of hypothyroidism      Discussion and recommendations:    Dominique Becker has symptoms suspicious  possible coronary disease unfortunately has a strong family history of involving her father as well as some other risk factors.  We will proceed with nuclear stress test in the very near future.  We will also check an echocardiogram to assess for structural functional abnormalities which could give rise symptoms.  She also has frequent ventricular ectopy noted on previous monitoring.  We will check serum chemistries including magnesium and also check thyroid function studies while CBC and vitamin D and B12 levels.9    We are concerned she may have sleep apnea with and we will ask that she be screened at home for this.    We will resume low-dose Crestor.  She states that she did not tolerate 40 mg daily we will try 5 mg daily.  We will recheck her lipids once this is performed.  We will give her nitroglycerin to use on a p.r.n. basis.  If she requires nitroglycerin were asked to immediately go the hospital work her up as an inpatient.  She has exacerbation of symptoms we have asked her to go to the hospital.    Thank you for referral.       Follow up:  No follow-ups on file.    Francella Solian, MD [facility], Valentino Hue, [facility]    A portion of this documentation may have been generated using [facility] voice recognition software and may contain syntax/voice recognition errors.

## 2021-11-24 ENCOUNTER — Inpatient Hospital Stay (INDEPENDENT_AMBULATORY_CARE_PROVIDER_SITE_OTHER)
Admission: RE | Admit: 2021-11-24 | Discharge: 2021-11-24 | Disposition: A | Discharge status: Home / Self Care | Payer: Medicare Other | Source: Ambulatory Visit

## 2021-11-24 ENCOUNTER — Other Ambulatory Visit (INDEPENDENT_AMBULATORY_CARE_PROVIDER_SITE_OTHER): Payer: Self-pay | Admitting: Family Medicine

## 2021-11-24 ENCOUNTER — Inpatient Hospital Stay (INDEPENDENT_AMBULATORY_CARE_PROVIDER_SITE_OTHER)
Admission: RE | Admit: 2021-11-24 | Discharge: 2021-11-24 | Disposition: A | Discharge status: Home / Self Care | Payer: Medicare Other | Source: Ambulatory Visit | Admitting: NUCLEAR MEDICINE

## 2021-11-24 ENCOUNTER — Other Ambulatory Visit: Payer: Self-pay

## 2021-11-24 DIAGNOSIS — R079 Chest pain, unspecified: Secondary | ICD-10-CM

## 2021-11-24 DIAGNOSIS — E0789 Other specified disorders of thyroid: Secondary | ICD-10-CM

## 2021-11-24 DIAGNOSIS — I491 Atrial premature depolarization: Secondary | ICD-10-CM

## 2021-11-24 DIAGNOSIS — I517 Cardiomegaly: Secondary | ICD-10-CM

## 2021-11-24 DIAGNOSIS — I493 Ventricular premature depolarization: Secondary | ICD-10-CM

## 2021-11-24 NOTE — Progress Notes (Signed)
Pt presents for evaluation of CP and frequent ventricular ectopy.     Myocardial perfusion imaging and a complete echocardiogram were performed in the office today. Results are pending. They will be forwarded to you under separate cover as soon as they are available. Further recommendations will be forthcoming pending the findings on today's studies.

## 2021-12-01 NOTE — Result Encounter Note (Signed)
Results to pt over telephone.  Lexys Milliner, LPN

## 2022-01-25 ENCOUNTER — Ambulatory Visit (INDEPENDENT_AMBULATORY_CARE_PROVIDER_SITE_OTHER): Payer: Self-pay | Admitting: Interventional Cardiology

## 2022-02-01 ENCOUNTER — Ambulatory Visit (INDEPENDENT_AMBULATORY_CARE_PROVIDER_SITE_OTHER): Payer: Self-pay | Admitting: PULMONARY DISEASE

## 2022-02-15 ENCOUNTER — Ambulatory Visit (INDEPENDENT_AMBULATORY_CARE_PROVIDER_SITE_OTHER): Payer: Self-pay | Admitting: PULMONARY DISEASE

## 2022-02-23 ENCOUNTER — Ambulatory Visit (INDEPENDENT_AMBULATORY_CARE_PROVIDER_SITE_OTHER): Payer: Self-pay | Admitting: PULMONARY DISEASE

## 2022-04-13 ENCOUNTER — Ambulatory Visit (HOSPITAL_COMMUNITY): Payer: Self-pay | Admitting: ORTHOPAEDIC SURGERY

## 2022-04-27 ENCOUNTER — Encounter (INDEPENDENT_AMBULATORY_CARE_PROVIDER_SITE_OTHER): Payer: Self-pay | Admitting: PULMONARY DISEASE

## 2022-04-27 ENCOUNTER — Ambulatory Visit: Payer: Medicare Other | Attending: PULMONARY DISEASE | Admitting: PULMONARY DISEASE

## 2022-04-27 ENCOUNTER — Other Ambulatory Visit: Payer: Self-pay

## 2022-04-27 VITALS — BP 142/80 | HR 92 | Temp 98.2°F | Resp 18 | Ht 64.0 in | Wt 245.0 lb

## 2022-04-27 DIAGNOSIS — R0683 Snoring: Secondary | ICD-10-CM | POA: Insufficient documentation

## 2022-04-27 DIAGNOSIS — R5383 Other fatigue: Secondary | ICD-10-CM

## 2022-04-27 DIAGNOSIS — G4710 Hypersomnia, unspecified: Secondary | ICD-10-CM

## 2022-04-27 DIAGNOSIS — E039 Hypothyroidism, unspecified: Secondary | ICD-10-CM | POA: Insufficient documentation

## 2022-04-27 DIAGNOSIS — R053 Chronic cough: Secondary | ICD-10-CM | POA: Insufficient documentation

## 2022-04-27 DIAGNOSIS — I493 Ventricular premature depolarization: Secondary | ICD-10-CM | POA: Insufficient documentation

## 2022-04-27 NOTE — Nursing Note (Signed)
Neck Circumference:  16 L  Epworth Score:  Score total: 0    How often do you snore? A LIL BIT    How often do you stop breathing?  NO    Are you experiencing current symptoms of OSA? Restless Leg  Are you currently on CPAP/BIPAP? n/a  DME Company Information: N/A    Have you had a previous sleep study?  No   If yes, what type of sleep study was it?  How long ago was the sleep study?  N/A    Location of previous sleep study?  N/A    Download available? No, Reason: N/A  Oxygen therapy?  No  DME Company Information: N/A    What Liter Flow is the patient on? N/A    Have you received a flu shot? No  Have you received a pneumonia shot? No  Smoking History:    Social History     Tobacco Use   Smoking Status Never   Smokeless Tobacco Never     Have you had any recent hospitalizations?  No  Has the patient had any tests performed since the last visit? None  If so, where were the test(s) performed? N/A    History of prior stroke?  No  History of Myocardial Infarction?  No  Suspicion of Period leg movement during sleep?  no  Leg pain?  No  When do you experience leg pain?  N/A    Does the patient have any other associated symptoms or modifying factors? Coughing

## 2022-04-27 NOTE — H&P (Signed)
[facility]  [place] [address]       New Patient    Patient Name: Dominique Becker  Date: 04/27/2022  Department:  [facility]  MRN: [id number]  DOB: 1960-12-01  Primary Care Provider:  Marjorie Smolder, MD  Referring Provider:  Francella Solian, MD      Chief Complaint:   Chief Complaint   Patient presents with    New Patient     Nursing Notes:   Fransisca Kaufmann, [place]  04/27/22 0825  Signed  Neck Circumference:  16 L  Epworth Score:  Score total: 0    How often do you snore? A LIL BIT    How often do you stop breathing?  NO    Are you experiencing current symptoms of OSA? Restless Leg  Are you currently on CPAP/BIPAP? n/a  DME Company Information: N/A    Have you had a previous sleep study?  No   If yes, what type of sleep study was it?  How long ago was the sleep study?  N/A    Location of previous sleep study?  N/A    Download available? No, Reason: N/A  Oxygen therapy?  No  DME Company Information: N/A    What Liter Flow is the patient on? N/A    Have you received a flu shot? No  Have you received a pneumonia shot? No  Smoking History:    Social History     Tobacco Use   Smoking Status Never   Smokeless Tobacco Never     Have you had any recent hospitalizations?  No  Has the patient had any tests performed since the last visit? None  If so, where were the test(s) performed? N/A    History of prior stroke?  No  History of Myocardial Infarction?  No  Suspicion of Period leg movement during sleep?  no  Leg pain?  No  When do you experience leg pain?  N/A    Does the patient have any other associated symptoms or modifying factors? Coughing              History of Present Illness:   Dominique Becker is a 62 y.o., White female presents today for an OSA evaluation, referred here by Dr Nonnie Done.  Has a history of hypothyroid and multiple PVCs that has continued fatigue and hypersomnia.  Has had mild to  moderate intermittent fatigue for the last few months.  States she thinks most of this is due to hypothyroid and life stressors.  Some mild intermittent snoring, but she contributes this to multiple "craniofacial surgeries" causing this.  She thinks her PVCs are due to the COVID vaccine.  No witnessed apneas.  No family hx of OSA.      States she has had a chronic dry cough for 20 yrs.  She would like to have a "baseline pulmonary."  Worse when she is exposed to different "smells."  No dyspnea or wheezing.  No history of asthma in life.  Life long non-smoker.  Has never tried an inhaler for this, and does not feel she needs this.  Does have GERD, but well controlled.    No other associated symptoms or modifying factors.    Worked as a Engineer, manufacturing systems.  Denies any job exposures.    Past Medical History  Past Medical History:   Diagnosis Date    Bone fibrous dysplasia  Depression     Disorder of thyroid     Dyslipidemia     GERD (gastroesophageal reflux disease)     Headache     History of skin cancer       Past Surgical History  Past Surgical History:   Procedure Laterality Date    FACIAL COSMETIC SURGERY      x25    HX BREAST BIOPSY      HX HYSTERECTOMY       Medication List  Current Outpatient Medications   Medication Sig    aspirin (ECOTRIN) 81 mg Oral Tablet, Delayed Release (E.C.) Take 1 Tablet (81 mg total) by mouth Once per day as needed for Pain (Patient not taking: Reported on 04/27/2022)    escitalopram oxalate (LEXAPRO) 10 mg Oral Tablet Take 1 Tablet (10 mg total) by mouth Once a day    ezetimibe (ZETIA) 10 mg Oral Tablet Take 1 Tablet (10 mg total) by mouth Once a day    multivitamin Oral Capsule Take 1 Capsule by mouth Once a day    omeprazole (PRILOSEC) 20 mg Oral Capsule, Delayed Release(E.C.)     rosuvastatin (CRESTOR) 5 mg Oral Tablet Take 1 Tablet (5 mg total) by mouth Every evening    SYNTHROID 175 mcg Oral Tablet Take 1 Tablet (175 mcg total) by mouth     Allergy List  Allergy History as of  04/27/22       MORPHINE         Noted Status Severity Type Reaction    09/22/17 1001 Jeanne Ivan, MA 09/22/17 Active Low  Nausea/ Vomiting              DOXYCYCLINE         Noted Status Severity Type Reaction    11/16/21 0851 Potasnik, Katharine Look, MA 11/16/21 Active                 ROSUVASTATIN         Noted Status Severity Type Reaction    04/27/22 0812 Fransisca Kaufmann, MA 11/16/21 Deleted       11/16/21 1134 Potasnik, [place], [place] 11/16/21 Active                 METRONIDAZOLE         Noted Status Severity Type Reaction    04/27/22 0801 Penn, Sharyn Lull, MA 10/07/11 Active Low  Nausea/ Vomiting    Comments: Other reaction(s): Nausea, Vomiting, Diarrhea               VANCOMYCIN HCL         Noted Status Severity Type Reaction    04/27/22 0801 Fransisca Kaufmann, MA 06/10/14 Active       Comments: Other reaction(s): itching                   Family History   Family Medical History:       Problem Relation (Age of Onset)    Aortic stenosis Father    Breast Cancer Mother    Cancer Mother    Heart Attack Father    Heart Disease Father    High Cholesterol Father            Social History  Social History     Socioeconomic History    Marital status: Single   Tobacco Use    Smoking status: Never    Smokeless tobacco: Never   Vaping Use    Vaping Use: Never used   Substance and Sexual Activity  Alcohol use: Never    Drug use: Never    Sexual activity: Not Currently        ROS negative other than above listed in HPI.    Vital Signs  Vitals:    04/27/22 0803   BP: (!) 142/80   Pulse: 92   Resp: 18   Temp: 36.8 C (98.2 F)   SpO2: 97%   Weight: 111 kg (245 lb)   Height: 1.626 m ('5\' 4"'$ )   BMI: 42.14        PHYSICAL EXAMINATION:   Constitutional:  Vital signs stable.  General appearance of the patient:  Alert, no acute distress.  Normal appearance, well nourished.  Ears, Nose, Mouth, and Throat: External inspection of ears and nose with normal appearance.  Inspection of lips, teeth and gums with normal appearance and oral mucosa  normal.  Neck: Supple with trachea midline, non tender, no nodules, no masses, gland position midline.  Respiratory:  Auscultation of lungs with normal breath sounds, no rales, no rhonchi, no wheezing.  Respiratory effort with no tractions, breathing regular and unlabored.  Cardiovascular:  Regular rhythm and regular rate.  No murmur, no peripheral edema.  Gastrointestinal: Abdomen non-tender, no masses, no hepatomegaly present.  Musculoskeletal:  Normal gait and station, normal digits, no digital cyanosis or clubbing.  Mental Status/Psychiatric:  Alert, grossly oriented to person, place, and time.  Appropriate and normal mood.    Assessment    ICD-10-CM    1. Fatigue, unspecified type  R53.83       2. Hypothyroidism, unspecified type  E03.9       3. PVCs (premature ventricular contractions)  I49.3       4. Snoring  R06.83       5. Chronic cough  R05.3       6. 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  Very reluctant for work up and treatment for OSA.  Will check home PSG per patient request and fu with PFT's to work up chronic cough.  Discussed and recommended starting albuterol, but she does not feel she is bad enough for this.    I personally saw and evaluated the patient as part of a shared service with an APP.    My substantive findings are:  MDM (complete) I have personally managed 2 or more stable chronic illnesses (chronic cough, OSA type symptoms)  I have reviewed and actively participated in management of patient's prescription medications. (Discussed as needed Albuterol HFA, PRN, which she declined)         Wendie Agreste, MD  04/27/2022 08:48      Wendie Agreste, MD  04/27/2022 08:48          Instructions  Continue medications as prescribed/directed unless changed by provider.  Plan of care discussed with patient.      The patient was given the opportunity to ask questions and those questions were answered to the patient's satisfaction. The patient was  encouraged to call with any additional questions or concerns. Discussed with the patient effects and side effects of medications. Medication safety was discussed.  The patient was informed to contact the office within 7 business days if a message/lab results/referral/imaging results have not been conveyed to the patient.    Electronically signed by Wendie Agreste, MD  Pulmonary and Critical care    This note may have been partially  generated using [employer] Fluency Direct system, and there may be some incorrect words, spellings, and punctuation that were not noted in checking the note before saving.

## 2022-06-08 ENCOUNTER — Other Ambulatory Visit (INDEPENDENT_AMBULATORY_CARE_PROVIDER_SITE_OTHER): Payer: Medicare Other

## 2022-06-22 ENCOUNTER — Other Ambulatory Visit (INDEPENDENT_AMBULATORY_CARE_PROVIDER_SITE_OTHER): Payer: Medicare Other

## 2022-08-03 ENCOUNTER — Ambulatory Visit (INDEPENDENT_AMBULATORY_CARE_PROVIDER_SITE_OTHER): Payer: Self-pay

## 2022-08-03 ENCOUNTER — Ambulatory Visit (INDEPENDENT_AMBULATORY_CARE_PROVIDER_SITE_OTHER): Payer: Self-pay | Admitting: PHYSICIAN ASSISTANT

## 2022-11-26 ENCOUNTER — Encounter (INDEPENDENT_AMBULATORY_CARE_PROVIDER_SITE_OTHER): Payer: Self-pay

## 2022-11-26 ENCOUNTER — Other Ambulatory Visit: Payer: Self-pay

## 2022-11-26 ENCOUNTER — Ambulatory Visit (INDEPENDENT_AMBULATORY_CARE_PROVIDER_SITE_OTHER): Payer: Medicare Other | Admitting: Physician Assistant

## 2022-11-26 VITALS — BP 126/80 | HR 79 | Ht 64.0 in | Wt 250.0 lb

## 2022-11-26 DIAGNOSIS — Z789 Other specified health status: Secondary | ICD-10-CM | POA: Insufficient documentation

## 2022-11-26 DIAGNOSIS — E782 Mixed hyperlipidemia: Secondary | ICD-10-CM

## 2022-11-26 DIAGNOSIS — I493 Ventricular premature depolarization: Secondary | ICD-10-CM

## 2022-11-26 DIAGNOSIS — E785 Hyperlipidemia, unspecified: Secondary | ICD-10-CM

## 2022-11-26 DIAGNOSIS — I459 Conduction disorder, unspecified: Secondary | ICD-10-CM

## 2022-11-26 DIAGNOSIS — I4729 Other ventricular tachycardia (CMS HCC): Secondary | ICD-10-CM

## 2022-11-26 DIAGNOSIS — Z8249 Family history of ischemic heart disease and other diseases of the circulatory system: Secondary | ICD-10-CM

## 2022-11-26 MED ORDER — ROSUVASTATIN 5 MG TABLET
5.0000 mg | ORAL_TABLET | Freq: Every evening | ORAL | 4 refills | Status: DC
Start: 2022-11-26 — End: 2023-11-30

## 2022-11-26 NOTE — Procedures (Signed)
[facility]Y [facility] [place]  [address]  [place] [address]    Procedure Note    Name: Dominique Becker MRN:  [id number]   Date: 11/26/2022 DOB:  1961/01/08 (62 y.o.)         ECG - In Clinic ([facility])    Performed by: Jori Moll, PA-C  Authorized by: Jori Moll, PA-C      Sinus rhythm, left axis deviation.    Jori Moll, PA-C

## 2022-11-26 NOTE — Progress Notes (Signed)
Cardiology [facility], [facility]  [address] [place] [address]  [phone]    Cardiology  Clinic Note    Name: Dominique Becker   DOB: 1960/10/15  [62 y.o. female]   MRN: [id number]       Visit Date: 11/26/2022   Referring: No referring provider defined for this encounter.   PCP: Berlin Hun, MD         Chief Complaint: Annual Exam      History of Present Illness   Dominique Becker is a 62 y.o. White female who presents for her annual evaluation.  She has no history of coronary artery disease.  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 Gerrie Nordmann.  She had a nuclear stress test 11/24/2021 that was negative for ischemia or infarction with 71% ejection fraction.  She also had an echocardiogram that revealed ejection fraction 55% with borderline LVH and diastolic dysfunction.  No significant valve disease.    She has no current cardiac complaints.  She denies chest pain, shortness of breath, orthopnea, PND, palpitations, presyncope, syncope, edema.      EKG today: Sinus rhythm, left axis deviation.    Lipid panel 07/05/2022: Total cholesterol 164, HDL 67, triglycerides 155, LDL 67.    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       Allergies  Allergies   Allergen Reactions    Doxycycline     Vancomycin Hcl      Other reaction(s): itching    Metronidazole Nausea/ Vomiting     Other reaction(s): Nausea, Vomiting, Diarrhea    Morphine Nausea/ Vomiting       Medications    Current Outpatient  Medications:     aspirin (ECOTRIN) 81 mg Oral Tablet, Delayed Release (E.C.), Take 1 Tablet (81 mg total) by mouth Once per day as needed for Pain, Disp: , Rfl:     escitalopram oxalate (LEXAPRO) 5 mg Oral Tablet, Take 1 Tablet (5 mg total) by mouth Once a day, Disp: , Rfl:     multivitamin Oral Capsule, Take 1 Capsule by mouth Once a day, Disp: , Rfl:     omeprazole (PRILOSEC) 20 mg Oral Capsule, Delayed Release(E.C.), , Disp: , Rfl:     rosuvastatin (CRESTOR) 5 mg Oral Tablet, Take 1 Tablet (5 mg total) by mouth Every evening, Disp: 90 Tablet, Rfl: 4    SYNTHROID 175 mcg Oral Tablet, Take 1 Tablet (175 mcg total) by mouth, Disp: , Rfl:     History  Past Medical History:   Diagnosis Date    Bone fibrous dysplasia     Depression     Disorder of thyroid     Dyslipidemia     GERD (gastroesophageal reflux disease)     Headache     History of skin cancer          Past Surgical History:   Procedure Laterality Date    FACIAL COSMETIC SURGERY      x25  HX BREAST BIOPSY      HX HYSTERECTOMY       Social History     Socioeconomic History    Marital status: Single   Tobacco Use    Smoking status: Never    Smokeless tobacco: Never   Vaping Use    Vaping status: Never Used   Substance and Sexual Activity    Alcohol use: Never    Drug use: Never    Sexual activity: Not Currently     Social Determinants of Health     Financial Resource Strain: Low Risk  (06/24/2021)    Received from [facility], [facility]    Overall Financial Resource Strain (CARDIA)     Difficulty of Paying Living Expenses: Not hard at all   Transportation Needs: No Transportation Needs (06/24/2021)    Received from [facility], [facility]    PRAPARE - Therapist, art (Medical): No     Lack of Transportation (Non-Medical): No   Housing Stability: High Risk (07/06/2021)    Received from [facility], [facility]    Housing Stability Vital Sign     Unable to Pay for Housing in the Last Year: Yes     Number of Places  Lived in the Last Year: 1     Unstable Housing in the Last Year: Yes     Family Medical History:       Problem Relation (Age of Onset)    Aortic stenosis Father    Breast Cancer Mother    Cancer Mother    Heart Attack Father    Heart Disease Father    High Cholesterol Father              Review of Systems:  Review of Systems   Constitutional: Negative for chills and fever.   Cardiovascular:  Negative for chest pain, leg swelling, near-syncope, orthopnea, palpitations, paroxysmal nocturnal dyspnea and syncope.   Respiratory:  Negative for cough and shortness of breath.    Endocrine: Negative for cold intolerance and heat intolerance.   Hematologic/Lymphatic: Negative for bleeding problem.   Musculoskeletal:  Negative for arthritis and gout.   Gastrointestinal:  Negative for abdominal pain, nausea and vomiting.   Neurological:  Negative for dizziness and weakness.   Psychiatric/Behavioral:  Negative for depression. The patient is not nervous/anxious.           Physical Examination:  BP 126/80   Pulse 79   Ht 1.626 m (5\' 4" )   Wt 113 kg (250 lb)   SpO2 95%   BMI 42.91 kg/m         Physical Exam  Constitutional:       General: She is not in acute distress.  Neck:      Vascular: No carotid bruit.      Comments: No JVD  Cardiovascular:      Rate and Rhythm: Normal rate and regular rhythm.      Heart sounds: No murmur heard.  Pulmonary:      Effort: No respiratory distress.      Breath sounds: No wheezing, rhonchi or rales.   Abdominal:      General: There is no distension.      Palpations: Abdomen is soft.   Musculoskeletal:         General: No swelling.   Neurological:      General: No focal deficit present.      Mental Status: She is alert.  Psychiatric:         Behavior: Behavior normal.             Orders Placed This Encounter    ECG - In Clinic (Non-Muse)    rosuvastatin (CRESTOR) 5 mg Oral Tablet       Medications Discontinued During This Encounter   Medication Reason    ezetimibe (ZETIA) 10 mg Oral Tablet  Patient states no longer taking    rosuvastatin (CRESTOR) 5 mg Oral Tablet Reorder       Assessment and Plan:  Assessment/Plan   1. PVCs (premature ventricular contractions)    2. NSVT (nonsustained ventricular tachycardia) (CMS HCC)    3. Mixed hyperlipidemia    4. Nonsmoker    5. Dyslipidemia        Dominique Becker is doing well from a cardiac standpoint.  She has no history of coronary artery disease and has no current anginal symptoms.  She has no symptoms suggesting significant arrhythmia.  Blood pressure and cholesterol are adequately controlled.  We will continue her current medical regimen and risk factor reduction.  She was encouraged to continue regular aerobic exercise.  She was asked to contact us if there are any changes in her cardiac status.  She will follow up in 1 year or sooner if needed.    Thank you for allowing [place] to participate in care of your patient.  If we can be of further assistance in her care at any time please let us know.      Sincerely,     Billie Lade, PA-C      Follow up:  Return in about 1 year (around 11/26/2023).        Jori Moll, PA-C  [facility]  Cardiology  [facility]    A portion of this documentation may have been generated using [facility] voice recognition software and may contain syntax/voice recognition errors.

## 2023-04-16 ENCOUNTER — Emergency Department
Admission: EM | Admit: 2023-04-16 | Discharge: 2023-04-16 | Disposition: A | Discharge status: Home / Self Care | Payer: Medicare Other | Attending: EMERGENCY MEDICINE | Admitting: EMERGENCY MEDICINE

## 2023-04-16 ENCOUNTER — Encounter (HOSPITAL_COMMUNITY): Payer: Self-pay

## 2023-04-16 ENCOUNTER — Emergency Department (HOSPITAL_COMMUNITY): Payer: Medicare Other

## 2023-04-16 ENCOUNTER — Other Ambulatory Visit: Payer: Self-pay

## 2023-04-16 DIAGNOSIS — Z6841 Body Mass Index (BMI) 40.0 and over, adult: Secondary | ICD-10-CM | POA: Insufficient documentation

## 2023-04-16 DIAGNOSIS — Z79899 Other long term (current) drug therapy: Secondary | ICD-10-CM

## 2023-04-16 DIAGNOSIS — E669 Obesity, unspecified: Secondary | ICD-10-CM | POA: Insufficient documentation

## 2023-04-16 DIAGNOSIS — R053 Chronic cough: Secondary | ICD-10-CM

## 2023-04-16 DIAGNOSIS — J101 Influenza due to other identified influenza virus with other respiratory manifestations: Secondary | ICD-10-CM | POA: Insufficient documentation

## 2023-04-16 DIAGNOSIS — R059 Cough, unspecified: Secondary | ICD-10-CM

## 2023-04-16 LAB — CBC WITH DIFF
HCT: 44 % (ref 34.8–46.0)
HGB: 14.5 g/dL (ref 11.5–16.0)
MCH: 28.4 pg (ref 26.0–32.0)
MCHC: 33 g/dL (ref 31.0–35.5)
MCV: 86.3 fL (ref 78.0–100.0)
MPV: 9.3 fL (ref 8.7–12.5)
PLATELETS: 180 10*3/uL (ref 150–400)
RBC: 5.1 10*6/uL (ref 3.85–5.22)
RDW-CV: 12.8 % (ref 11.5–15.5)
WBC: 5 10*3/uL (ref 3.7–11.0)

## 2023-04-16 LAB — BASIC METABOLIC PANEL
ANION GAP: 11 mmol/L
BUN/CREA RATIO: 19
BUN: 15 mg/dL (ref 8–23)
CALCIUM: 9.2 mg/dL (ref 8.3–10.7)
CHLORIDE: 104 mmol/L (ref 96–106)
CO2 TOTAL: 25 mmol/L (ref 22–30)
CREATININE: 0.77 mg/dL (ref 0.50–0.90)
ESTIMATED GFR: 87 mL/min/{1.73_m2} — ABNORMAL LOW (ref >90)
GLUCOSE: 106 mg/dL (ref 74–109)
POTASSIUM: 4.4 mmol/L (ref 3.2–5.0)
SODIUM: 140 mmol/L (ref 133–144)

## 2023-04-16 LAB — MANUAL DIFF AND MORPHOLOGY-SYSMEX
BASOPHIL #: <0.1 10*3/uL (ref <=0.20)
BASOPHIL %: 0 %
EOSINOPHIL #: <0.1 10*3/uL (ref <=0.50)
EOSINOPHIL %: 1 %
LYMPHOCYTE #: 2.1 10*3/uL (ref 1.00–4.80)
LYMPHOCYTE %: 39 %
MONOCYTE #: 0.2 10*3/uL (ref 0.20–1.10)
MONOCYTE %: 4 %
NEUTROPHIL #: 2.65 10*3/uL (ref 1.50–7.70)
NEUTROPHIL %: 52 %
NEUTROPHIL BANDS %: 1 %
REACTIVE LYMPHOCYTE %: 3 %

## 2023-04-16 LAB — PT/INR
INR: 0.92 (ref 0.86–1.14)
PROTHROMBIN TIME: 12.6 s (ref 11.4–14.2)

## 2023-04-16 LAB — PTT (PARTIAL THROMBOPLASTIN TIME): APTT: 26.9 s (ref 24.0–35.6)

## 2023-04-16 MED ORDER — ONDANSETRON 4 MG DISINTEGRATING TABLET
4.0000 mg | ORAL_TABLET | Freq: Three times a day (TID) | ORAL | 0 refills | Status: DC | PRN
Start: 2023-04-16 — End: 2023-11-28

## 2023-04-16 NOTE — ED Provider Notes (Signed)
[facility] - Emergency Department  ED Physician Note      Arrival: Car    Chief Complaint:    Chief Complaint   Patient presents with    URI     Dominique Becker is a 62 y.o. female who had concerns including URI.    History of Present Illness:    URI  Presenting symptoms: cough    The patient presents 7 days into a respiratory infection bronchitis.  She has had some small amount of sputum.  Cough congestion tested positive for flu A on Thursday.  She is placed on Tamiflu but states this made her vomit uncontrollably.  Felt worse.  She discontinued after 3 doses.  Presents today out of concern that something else may be going on.  She is not showing improvement.  Still running intermittent fever and fatigue.  Cough.    Review of Systems:  Review of Systems   Constitutional:  Positive for malaise/fatigue.   Respiratory:  Positive for cough.    All other systems reviewed and are negative.        PMH/PSH/FH/SH:    Past Medical History:   Diagnosis Date    Bone fibrous dysplasia     Depression     Disorder of thyroid     Dyslipidemia     GERD (gastroesophageal reflux disease)     Headache     History of skin cancer        Past Surgical History:   Procedure Laterality Date    Facial cosmetic surgery      Hx breast biopsy      Hx hysterectomy         Family History   Problem Relation Age of Onset    Cancer Mother     Breast Cancer Mother     High Cholesterol Father     Heart Attack Father     Heart Disease Father     Aortic stenosis Father        Social History     Socioeconomic History    Marital status: Single     Spouse name: Not on file    Number of children: Not on file    Years of education: Not on file    Highest education level: Not on file   Occupational History    Not on file   Tobacco Use    Smoking status: Never    Smokeless tobacco: Never   Vaping Use    Vaping status: Never Used   Substance and Sexual Activity    Alcohol use: Never    Drug use: Never    Sexual activity: Not Currently   Other  Topics Concern    Not on file   Social History Narrative    Not on file     Social Determinants of Health     Financial Resource Strain: Low Risk  (06/24/2021)    Received from [facility], [facility], [employer], [place]    Overall Physicist, medical Strain (CARDIA)     Difficulty of Paying Living Expenses: Not hard at all   Transportation Needs: No Transportation Needs (06/24/2021)    Received from [facility], [facility], [employer], [place]    [employer] - Therapist, art (Medical): No     Lack of Transportation (Non-Medical): No   Social Connections: Not on file   Intimate Partner Violence: Not on file  Housing Stability: High Risk (07/06/2021)    Received from [facility], [facility], [employer], [facility]    Housing Stability Vital Sign     Unable to Pay for Housing in the Last Year: Yes     Number of Places Lived in the Last Year: 1     Unstable Housing in the Last Year: Yes       Meds/Allergies:    Current Outpatient Medications   Medication Instructions    aspirin (ECOTRIN) 81 mg, Oral, DAILY PRN    escitalopram oxalate (LEXAPRO) 5 mg, Oral, DAILY    multivitamin Oral Capsule 1 Capsule, Oral, DAILY    omeprazole (PRILOSEC) 20 mg Oral Capsule, Delayed Release(E.C.) No dose, route, or frequency recorded.    ondansetron (ZOFRAN ODT) 4 mg, Oral, EVERY 8 HOURS PRN    rosuvastatin (CRESTOR) 5 mg, Oral, EVERY EVENING    Synthroid 175 mcg, Oral        Allergies   Allergen Reactions    Doxycycline     Vancomycin Hcl      Other reaction(s): itching    Metronidazole Nausea/ Vomiting     Other reaction(s): Nausea, Vomiting, Diarrhea    Morphine Nausea/ Vomiting       Physical Exam:    ED Triage Vitals [04/16/23 1003]   BP (Non-Invasive) (!) 141/102   Heart Rate 81   Respiratory Rate 18   Temperature 36.6 C (97.9 F)   SpO2 96 %   Weight 111 kg (245 lb)   Height 1.626 m (5\' 4" )     Physical Exam  Vitals and nursing note reviewed.    Constitutional:       General: She is not in acute distress.     Appearance: Normal appearance. She is well-developed. She is obese.   HENT:      Head: Normocephalic and atraumatic.   Eyes:      Extraocular Movements: Extraocular movements intact.      Conjunctiva/sclera: Conjunctivae normal.      Pupils: Pupils are equal, round, and reactive to light.   Cardiovascular:      Rate and Rhythm: Normal rate and regular rhythm.      Pulses: Normal pulses.      Heart sounds: Normal heart sounds. No murmur heard.  Pulmonary:      Effort: Pulmonary effort is normal. No respiratory distress.      Breath sounds: Normal breath sounds.   Abdominal:      Palpations: Abdomen is soft.      Tenderness: There is no abdominal tenderness.   Musculoskeletal:         General: No swelling.      Cervical back: Neck supple.   Skin:     General: Skin is warm and dry.      Capillary Refill: Capillary refill takes less than 2 seconds.   Neurological:      General: No focal deficit present.      Mental Status: She is alert and oriented to person, place, and time. Mental status is at baseline.   Psychiatric:         Mood and Affect: Mood normal.          Results:    Labs Ordered/Reviewed   BASIC METABOLIC PANEL - Abnormal; Notable for the following components:       Result Value    ESTIMATED GFR 87 (*)     All other components within normal limits   MANUAL DIFF AND  MORPHOLOGY-SYSMEX - Abnormal; Notable for the following components:    ACANTHOCYTES (SPUR CELL) 2+/Moderate (*)     MICROCYTOSIS 2+/Moderate (*)     All other components within normal limits   PT/INR - Normal    Narrative:     In the setting of warfarin therapy, a moderate-intensity INR goal range is 2.0 to 3.0 and a high-intensity INR goal range is 2.5 to 3.5.    INR is ONLY validated to determine the level of anticoagulation with vitamin K antagonists (warfarin). Other factors may elevate the INR including but not limited to direct oral anticoagulants (DOACs), liver dysfunction,  vitamin K deficiency, DIC, factor deficiencies, and factor inhibitors.   PTT (PARTIAL THROMBOPLASTIN TIME) - Normal    Narrative:     +++++ New Range Effective 09/09/20 +++++    ** HEPARIN THERAPEUTIC RANGE: 62.4 - 94.8 SECONDS **  Please refer to the Heparin Protocol for Dosing    **CRITICAL VALUE IS >150.0 SECONDS**   CBC WITH DIFF - Normal   CBC/DIFF    Narrative:     The following orders were created for panel order CBC/DIFF.  Procedure                               Abnormality         Status                     ---------                               -----------         ------                     CBC WITH MWUX[[id number]]                Normal              Final result               MANUAL DIFF AND MORPHOLO.Marland KitchenMarland Kitchen[[id number]]  Abnormal            Final result                 Please view results for these tests on the individual orders.       XR CHEST PA AND LATERAL   Final Result by Edi, Radresults In (12/28 1044)      No evidence of acute intrathoracic disease.            Radiologist location ID: [id number]             ED Course:    ED Course as of 04/16/23 1145   Sat Apr 16, 2023   1142 CBC/DIFF(!)   1142 BASIC METABOLIC PANEL(!)   1142 aPTT: 26.9   1142 XR CHEST PA AND LATERAL  The patient's laboratory evaluation imaging evaluation is independently viewed and interpreted.  CBC basic metabolic panel and coags were within normal limits.  Chest x-ray shows no acute cardiopulmonary process specifically no pneumonia                    Procedures      MDM:       Medical Decision Making  The patient complains of persistent symptoms for after a diagnosis of flu a on Thursday.  Symptoms has been about a  week in duration.  She complain of some bleeding from her gums and nausea or vomiting after initiating Tamiflu.  Her main complaint now is nausea and vomiting.  I have it revised to discontinue the Tamiflu.  Will write some Zofran.  Have reassured her that viral illness like such will take 10-14 days for recovery.  The  patient is given reasons to return otherwise.  The patient's laboratory evaluation focused here on her gum bleeding.  There was no evidence of anemia.  No evidence of coagulopathy.  No evidence of low platelets.  This could be due to irritation and gingivitis from frequent vomiting.  Differential this patient includes viral illness, pneumonia, pneumothorax, PE.  Do not suspect acute cardiopulmonary process in his patient.  Sepsis is not seem likely given patient's vitals and lack of white count    Amount and/or Complexity of Data Reviewed  Labs: ordered. Decision-making details documented in ED Course.  Radiology: ordered and independent interpretation performed. Decision-making details documented in ED Course.    Risk  OTC drugs.        Impression:    Clinical Impression   Influenza A (Primary)         Disposition:   Discharged        Part of this note may have been generated using voice recognition software.  Be advised, it is possible that the generated note may be prone to syntax and other dictation software errors.

## 2023-04-16 NOTE — ED Nurses Note (Signed)

## 2023-04-16 NOTE — ED Triage Notes (Signed)
Pt reports cough, congestion, N/V, bleeding gums. Pt states she tested positive for Flu A at her PCP, was given Tamiflu, was able to take 3 pills before she was unable to tolerate the medication. Pt reports nausea, states she cannot keep anything down. Pt states she does not take a blood thinner.

## 2023-04-16 NOTE — Discharge Instructions (Signed)
It is very important you follow up with your family doctor or establish with a primary care physician to discuss all of your results from today's visit.  Discuss with them any new medications prescribed to be sure these do not interact with any your already taking.  Do not drive or operate heavy machinery while taking any pain medication or muscle relaxers.  Please return immediately if your condition worsens, further concerns arise, or if you had trouble getting a follow-up appointment.

## 2023-06-21 ENCOUNTER — Other Ambulatory Visit: Payer: Self-pay

## 2023-06-21 ENCOUNTER — Ambulatory Visit (HOSPITAL_COMMUNITY)

## 2023-06-21 DIAGNOSIS — R079 Chest pain, unspecified: Secondary | ICD-10-CM

## 2023-06-21 DIAGNOSIS — R002 Palpitations: Secondary | ICD-10-CM

## 2023-06-21 LAB — CBC WITH DIFF
BASOPHIL #: <0.1 10*3/uL (ref <=0.20)
BASOPHIL %: 0.5 %
EOSINOPHIL #: <0.1 10*3/uL (ref <=0.50)
EOSINOPHIL %: 0.5 %
HCT: 42.6 % (ref 34.8–46.0)
HGB: 14.2 g/dL (ref 11.5–16.0)
IMMATURE GRANULOCYTE #: <0.1 10*3/uL (ref <0.10)
IMMATURE GRANULOCYTE %: 0.4 % (ref 0.0–1.0)
LYMPHOCYTE #: 2.68 10*3/uL (ref 1.00–4.80)
LYMPHOCYTE %: 24.1 %
MCH: 28.6 pg (ref 26.0–32.0)
MCHC: 33.3 g/dL (ref 31.0–35.5)
MCV: 85.9 fL (ref 78.0–100.0)
MONOCYTE #: 0.64 10*3/uL (ref 0.20–1.10)
MONOCYTE %: 5.8 %
MPV: 9.2 fL (ref 8.7–12.5)
NEUTROPHIL #: 7.64 10*3/uL (ref 1.50–7.70)
NEUTROPHIL %: 68.7 %
PLATELETS: 260 10*3/uL (ref 150–400)
RBC: 4.96 10*6/uL (ref 3.85–5.22)
RDW-CV: 13 % (ref 11.5–15.5)
WBC: 11.1 10*3/uL — ABNORMAL HIGH (ref 3.7–11.0)

## 2023-06-21 LAB — COMPREHENSIVE METABOLIC PANEL, NON-FASTING
ALBUMIN: 4.5 g/dL (ref 3.5–5.2)
ALKALINE PHOSPHATASE: 85 U/L (ref 35–129)
ALT (SGPT): 19 U/L (ref 0–33)
ANION GAP: 14 mmol/L
AST (SGOT): 19 U/L (ref 0–32)
BILIRUBIN TOTAL: 0.8 mg/dL (ref 0.2–1.2)
BUN: 18 mg/dL (ref 8–23)
CALCIUM: 9.9 mg/dL (ref 8.3–10.7)
CHLORIDE: 102 mmol/L (ref 96–106)
CO2 TOTAL: 24 mmol/L (ref 22–30)
CREATININE: 0.74 mg/dL (ref 0.50–0.90)
ESTIMATED GFR: >90 mL/min/{1.73_m2} (ref >90)
GLUCOSE: 100 mg/dL (ref 74–109)
POTASSIUM: 4 mmol/L (ref 3.2–5.0)
PROTEIN TOTAL: 7.5 g/dL (ref 6.4–8.3)
SODIUM: 140 mmol/L (ref 133–144)

## 2023-06-21 LAB — TROPONIN-T
TROPONIN-T: <6 ng/L (ref <=14)
TROPONIN-T: 8 ng/L (ref <=14)

## 2023-06-21 LAB — NT-PROBNP: NT-PROBNP: <5 pg/mL (ref 0–125)

## 2023-06-21 NOTE — ED Triage Notes (Signed)
 [facility] - Emergency Department   Physician/APP in Triage Note  Medical Screening Exam     Date and Time of Assessment: 06/21/2023 18:18     Chief Complaint   Patient presents with    Palpitations     Pt reports palpitations today and BP 220/170. Pt reports intermittent left shoulder and midsternal chest pressure radiating into middle back pain but reports doing strenuous movement today. Heart hx includes irregular heart beats, has seen Dr. Kavin Leech. Anxiety hx.        Brief HPI:   Patient follows with EP and Cardio and was diagnosed with an "irregular heart beat" in August 2024 with cardiac workup benign otherwise.  Patient reports palpitations that began today. She states a "tightness" in the sternal chest intermittently today. She states she doesn't think she has any chest pain currently but states "my anxiety is through the roof."  She reports home blood pressure approx a hour ago that was 220/170. Blood pressure in triage is 130/95. She denies any shortness of breath, abdominal pain, or fever.    Focused Physical Exam: .  ED Triage Vitals [06/21/23 1818]   BP (Non-Invasive) (!) 130/95   Heart Rate (!) 123   Respiratory Rate 18   Temperature 36.8 C (98.2 F)   SpO2 97 %   Weight 111 kg (245 lb)   Height 1.626 m (5\' 4" )       Gen: Nontoxic in appearance.  Psych: Patient does have capacity.  Head: NC/AT  Neck: Trachea midline. No gross meningismus.    Assessment: Medical Screening Exam.    I have considered the patient's social determinants of health and health care literacy while determining the patient's initial plan of care.  I have further advised that the patient will possibly see a 2nd emergency provider today during their stay to help promote throughput.    I have performed an initial medical screening evaluation in order to determine if the patient has an emergency medical condition. The patient has been evaluated.  Imminent life, limb, or sight threatening emergency will be taken to the  treatment area immediately. The patient's orders will be reviewed by nursing staff and placed in the treatment area as soon as possible as bed capacity allows.  Should the patient's status change, or a new sign/symptom develop, immediate notification of nursing or ED staff has been advised.      Raynelle Highland, PA-C  06/21/2023 18:24       Preliminary Plan:  Labs ordered  EKG ordered  Imaging ordered

## 2023-06-22 ENCOUNTER — Emergency Department (HOSPITAL_COMMUNITY)

## 2023-06-22 ENCOUNTER — Encounter (HOSPITAL_COMMUNITY): Payer: Self-pay | Admitting: Emergency Medicine

## 2023-06-22 ENCOUNTER — Emergency Department
Admission: EM | Admit: 2023-06-22 | Discharge: 2023-06-22 | Disposition: A | Discharge status: Home / Self Care | Source: Ambulatory Visit | Attending: Emergency Medicine | Admitting: Emergency Medicine

## 2023-06-22 DIAGNOSIS — R000 Tachycardia, unspecified: Secondary | ICD-10-CM | POA: Insufficient documentation

## 2023-06-22 DIAGNOSIS — Z79899 Other long term (current) drug therapy: Secondary | ICD-10-CM | POA: Insufficient documentation

## 2023-06-22 DIAGNOSIS — R002 Palpitations: Secondary | ICD-10-CM | POA: Insufficient documentation

## 2023-06-22 DIAGNOSIS — R9431 Abnormal electrocardiogram [ECG] [EKG]: Secondary | ICD-10-CM | POA: Insufficient documentation

## 2023-06-22 DIAGNOSIS — F419 Anxiety disorder, unspecified: Secondary | ICD-10-CM | POA: Insufficient documentation

## 2023-06-22 LAB — THYROID STIMULATING HORMONE WITH FREE T4 REFLEX: TSH: 0.24 u[IU]/mL — ABNORMAL LOW (ref 0.270–4.200)

## 2023-06-22 LAB — ECG 12 LEAD
Atrial Rate: 111 {beats}/min
Calculated P Axis: 52 degrees
Calculated R Axis: -35 degrees
Calculated T Axis: 49 degrees
PR Interval: 132 ms
QRS Duration: 88 ms
QT Interval: 346 ms
QTC Calculation: 470 ms
Ventricular rate: 111 {beats}/min

## 2023-06-22 LAB — MAGNESIUM: MAGNESIUM: 1.9 mg/dL (ref 1.6–2.4)

## 2023-06-22 LAB — THYROXINE, FREE (FREE T4): THYROXINE (T4), FREE: 1.54 ng/dL (ref 0.93–1.70)

## 2023-06-22 MED ORDER — SODIUM CHLORIDE 0.9 % IV BOLUS
1000.0000 mL | INJECTION | Status: AC
Start: 2023-06-22 — End: 2023-06-22
  Administered 2023-06-22: 0 mL via INTRAVENOUS
  Administered 2023-06-22: 1000 mL via INTRAVENOUS

## 2023-06-22 NOTE — ED Provider Notes (Signed)
 [facility]        Chief Complaint:  Patient presents with     Chief Complaint   Patient presents with    Palpitations     Pt reports palpitations today and BP 220/170. Pt reports intermittent left shoulder and midsternal chest pressure radiating into middle back pain but reports doing strenuous movement today. Heart hx includes irregular heart beats, has seen Dr. Kavin Leech. Anxiety hx.          HPI:   Dominique Becker, date of birth 1961/04/09, is a 63 y.o. female who presents to the Emergency Department via Car and is alone with a chief complaint of palpitations.  She states this morning, she was not feeling quite right, but she spent some time working in her garage to take her mind off it.  After she finished working, she states she could feel her heart racing.  She also reports her chest felt a little tight.  No shortness of breath.  She walked across the street to her neighbor's house and checked her blood pressure.  She states her blood pressure was 220/170 at her neighbor's house.  This prompted her to come in for evaluation.  She states she has been evaluated for the irregular heartbeat in the past and follows with Dr. Kavin Leech.  She also reports a history of anxiety  on Lexapro, but does not feel that her anxiety has been well controlled lately.  At the time of my evaluation, she reports feeling better.  Her symptoms have completely resolved.        History:   The following were reviewed: Medical History  Surgical History  Family History  Social History      Vital Signs:  Pre-disposition vitals:  ED Triage Vitals [06/21/23 1818]   BP (Non-Invasive) (!) 130/95   Heart Rate (!) 123   Respiratory Rate 18   Temperature 36.8 C (98.2 F)   SpO2 97 %   Weight 111 kg (245 lb)   Height 1.626 m (5\' 4" )       PE:   Nursing notes and vital signs reviewed.  Constitutional: 63 y.o. female appears stated age, in no acute distress, normal color.   HEENT:    Head:  Normocephalic  and atraumatic.    Eyes:  EOMI   Mouth/Throat:  Mucous membranes moist.    Neck:  Trachea midline. Neck supple.   Cardiovascular:  RRR, no murmur appreciated. Intact distal pulses.  Pulmonary/Chest:   No respiratory distress. Breath sounds clear and equal bilaterally. No wheezes or rales. No chest wall tenderness to palpation.   Abdominal:  BS +. Abdomen soft, no tenderness, rebound or guarding.     Musculoskeletal:  No edema, tenderness or deformity noted.  Skin:  Warm and dry.   Psychiatric:  Appropriate mood and affect for situation. Behavior is normal.   Neurological:  Patient keenly alert and responsive, facies symmetric, moving all extremities equally and fully      Orders:  Orders Placed This Encounter    XR AP MOBILE CHEST    BNP    CBC/DIFF    COMPREHENSIVE METABOLIC PANEL, NON-FASTING    TROPONIN-T NOW    TROPONIN-T IN TWO HOURS    CBC WITH DIFF    THYROID STIMULATING HORMONE WITH FREE T4 REFLEX    MAGNESIUM    THYROXINE, FREE (FREE T4)    ECG 12 LEAD    NS bolus infusion 1,000 mL  ED Course/Medical Decision Making:  Dominique Becker is a 63 y.o. female who presents for evaluation of palpitations.  She was tachycardic in triage, but heart rate had returned to normal at the time of my evaluation.  Ddx includes, but is not limited to:  Dysrhythmia versus electrolyte abnormality versus anemia versus anxiety versus other  Appropriate labs/imaging ordered and reviewed/interpreted by myself during ED stay.    ED Course as of 06/22/23 0541   Wed Jun 22, 2023   0046 Temperature: 36.8 C (98.2 F)   0046 BP (Non-Invasive)(!): 130/95   0046 Heart Rate(!): 123   0046 Respiratory Rate: 18   0046 SpO2: 97 %   0049 ECG 12 LEAD  Sinus tachycardia  Left axis deviation  Abnormal ECG  No previous ECGs available  Confirmed by Camille Bal ([id number]) on 06/22/2023 12:27:09 AM   0049 XR AP MOBILE CHEST  No acute abnormality   0049 WBC(!): 11.1   0049 HGB: 14.2   0049 HCT: 42.6   0049 COMPREHENSIVE METABOLIC PANEL,  NON-FASTING  WNL   0050 TROPONIN-T: 8   0050 NT-PROBNP: <5   0139 Temperature: 36.6 C (97.9 F)   0139 BP (Non-Invasive): 108/65   0139 Heart Rate: 88   0139 Respiratory Rate: 16   0139 SpO2: 99 %   0140 TSH(!): 0.240   0140 MAGNESIUM: 1.9   0153 THYROXINE, FREE (FREE T4): 1.54   0200 On reassessment, she continues to feel well.  No additional palpitations.  All results reviewed.  She feels ready for discharge.  She is encouraged to follow closely with her primary care physician and to discuss her symptoms with her primary cardiologist.  She is advised to return to the emergency department with recurrent symptoms or with additional concerns.       Medications Ordered/Administered in the ED   NS bolus infusion 1,000 mL (0 mL Intravenous Stopped 06/22/23 0119)     Medical Decision Making  Problems Addressed:  Palpitations: acute illness or injury that poses a threat to life or bodily functions    Amount and/or Complexity of Data Reviewed  Labs: ordered. Decision-making details documented in ED Course.  Radiology: ordered and independent interpretation performed. Decision-making details documented in ED Course.  ECG/medicine tests: ordered and independent interpretation performed. Decision-making details documented in ED Course.          Procedures:    None.          Clinical Impression   Palpitations (Primary)         Disposition: Discharged    Following the above history, physical exam, and studies, the patient was deemed stable and suitable for discharge. The patient was advised to return to the ED for any new or worsening symptoms. Discharge medications, and follow-up instructions were discussed with the patient/patient's family in detail, who verbalize understanding. The patient/patient's family is in agreement and is comfortable with the plan of care.            This chart may have been completed after the conclusion of this patient's care due to the time constraints of simultaneous responsiltibites of direct  patient care activities during the clinical shift in the emergency department.     This note was partially generated using MModal Fluency Direct system, and there may be some incorrect words, spellings, and punctuation that were not noted in checking the note before saving.     Camille Bal, MD 06/22/2023, 05:41    Department of Emergency Medicine  [facility] [employer] [facility]

## 2023-06-22 NOTE — ED Notes (Signed)

## 2023-08-15 LAB — COLOGUARD® COLON CANCER SCREEN: COLOGUARD RESULT: NEGATIVE

## 2023-11-28 ENCOUNTER — Other Ambulatory Visit: Payer: Self-pay

## 2023-11-28 ENCOUNTER — Encounter (INDEPENDENT_AMBULATORY_CARE_PROVIDER_SITE_OTHER): Payer: Self-pay

## 2023-11-28 ENCOUNTER — Ambulatory Visit (INDEPENDENT_AMBULATORY_CARE_PROVIDER_SITE_OTHER): Payer: Self-pay | Admitting: Physician Assistant

## 2023-11-28 VITALS — BP 144/88 | HR 81 | Ht 64.0 in | Wt 264.0 lb

## 2023-11-28 DIAGNOSIS — Z6841 Body Mass Index (BMI) 40.0 and over, adult: Secondary | ICD-10-CM | POA: Insufficient documentation

## 2023-11-28 DIAGNOSIS — E785 Hyperlipidemia, unspecified: Secondary | ICD-10-CM

## 2023-11-28 DIAGNOSIS — I4729 Other ventricular tachycardia: Secondary | ICD-10-CM

## 2023-11-28 DIAGNOSIS — I493 Ventricular premature depolarization: Secondary | ICD-10-CM

## 2023-11-28 DIAGNOSIS — Z8249 Family history of ischemic heart disease and other diseases of the circulatory system: Secondary | ICD-10-CM

## 2023-11-28 DIAGNOSIS — I445 Left posterior fascicular block: Secondary | ICD-10-CM

## 2023-11-28 DIAGNOSIS — I10 Essential (primary) hypertension: Secondary | ICD-10-CM

## 2023-11-28 HISTORY — DX: Essential (primary) hypertension: I10

## 2023-11-28 MED ORDER — NITROGLYCERIN 0.4 MG SUBLINGUAL TABLET
0.4000 mg | SUBLINGUAL_TABLET | SUBLINGUAL | 3 refills | Status: AC | PRN
Start: 2023-11-28 — End: ?

## 2023-11-28 NOTE — Procedures (Signed)
 CARDIOLOGY [facility]  [address]  [place] [address]    Procedure Note    Name: [name] MRN:  [id number]   Date: 11/28/2023 DOB:  06-Jun-1960 (63 y.o.)         ECG - In Clinic (Non-Muse )    Performed by: Chinita Katz, PA-C  Authorized by: Chinita Katz, PA-C      Sinus rhythm, left axis deviation.    Katz Chinita, PA-C

## 2023-11-28 NOTE — Progress Notes (Signed)
 Cardiology [facility], [facility]  [address] [place] [address]  [phone]    Cardiology  Clinic Note    Name: Dominique Becker   DOB: 09-17-1960  [63 y.o. female]   MRN: [id number]       Visit Date: 11/28/2023   Referring: No referring provider defined for this encounter.   PCP: Wanda Conradi, MD         Chief Complaint: Annual Exam      History of Present Illness   Dominique Becker is a 63 y.o. White female who presents for her annual evaluation.  She has no history of coronary artery disease.  She has a history of frequent ventricular ectopy and nonsustained ventricular tachycardia on previous monitoring and follows with [facility] electrophysiology, Dr. Clarise.  She had a nuclear stress test 11/24/2021 that was negative for ischemia with 71% ejection fraction.  Echocardiogram revealed ejection fraction 55% with borderline LVH and diastolic dysfunction.    She continues to have occasional palpitations.  She denies chest pain, shortness of breath, orthopnea, PND, presyncope, syncope, edema.  She is hypertensive today but states that her blood pressure is typically normal at home.  She has struggled for many years with obesity and has not been able to lose weight.  She tried Mounjaro but was intolerant with gastrointestinal side effects.    EKG today: Sinus rhythm, left axis deviation.    Lipid panel 07/07/2023: Total cholesterol 178, HDL 52, triglycerides 200, LDL 86.    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       Allergies  Allergies[1]    Medications  Current Medications[2]    History  Past Medical History:   Diagnosis Date    Bone fibrous dysplasia     Depression     Disorder of thyroid      Dyslipidemia     Essential hypertension 11/28/2023    GERD (gastroesophageal reflux disease)     Headache     History of skin cancer          Past Surgical History:   Procedure Laterality Date    FACIAL COSMETIC SURGERY      x25    HX BREAST BIOPSY      HX HYSTERECTOMY       Social History     Socioeconomic History    Marital status: Single   Tobacco Use    Smoking status: Never    Smokeless tobacco: Never   Vaping Use    Vaping status: Never Used   Substance and Sexual Activity    Alcohol use: Never    Drug use: Never    Sexual activity: Not Currently     Social Determinants of Health     Financial Resource Strain: Low Risk  (07/07/2023)    Received from [employer], [facility]    Overall Financial Resource Strain (CARDIA)     Difficulty of  Paying Living Expenses: Not hard at all   Transportation Needs: No Transportation Needs (07/07/2023)    Received from [facility], [facility]    PRAPARE - Transportation     Lack of Transportation (Medical): No     Lack of Transportation (Non-Medical): No   Housing Stability: Low Risk  (07/07/2023)    Received from [facility], [facility]    Housing Stability Vital Sign     Unable to Pay for Housing in the Last Year: No     Number of Times Moved in the Last Year: 0     Homeless in the Last Year: No     Family Medical History:       Problem Relation (Age of Onset)    Aortic stenosis Father    Breast Cancer Mother    Cancer Mother    Heart Attack Father    Heart Disease Father    High Cholesterol Father              Review of Systems:  Review of Systems   Constitutional: Negative for chills and fever.   Cardiovascular:  Positive for palpitations. Negative for chest pain, leg swelling, near-syncope,  orthopnea, paroxysmal nocturnal dyspnea and syncope.   Respiratory:  Negative for cough and shortness of breath.    Endocrine: Negative for cold intolerance and heat intolerance.   Hematologic/Lymphatic: Negative for bleeding problem.   Musculoskeletal:  Negative for arthritis and gout.   Gastrointestinal:  Negative for abdominal pain, nausea and vomiting.   Neurological:  Negative for dizziness and weakness.   Psychiatric/Behavioral:  Negative for depression. The patient is not nervous/anxious.           Physical Examination:  BP (!) 144/88   Pulse 81   Ht 1.626 m (5' 4)   Wt 120 kg (264 lb)   SpO2 98%   BMI 45.32 kg/m         Physical Exam  Constitutional:       General: She is not in acute distress.  Neck:      Vascular: No carotid bruit.      Comments: No JVD  Cardiovascular:      Rate and Rhythm: Normal rate and regular rhythm.      Heart sounds: No murmur heard.  Pulmonary:      Effort: No respiratory distress.      Breath sounds: No wheezing, rhonchi or rales.   Abdominal:      General: There is no distension.      Palpations: Abdomen is soft.   Musculoskeletal:         General: No swelling.   Neurological:      General: No focal deficit present.      Mental Status: She is alert.   Psychiatric:         Behavior: Behavior normal.             Orders Placed This Encounter    Referral to Provider - External    ECG - In Clinic (Non-Muse )    nitroGLYCERIN  (NITROSTAT ) 0.4 mg Sublingual Tablet, Sublingual       Medications Discontinued During This Encounter   Medication Reason    ondansetron  (ZOFRAN  ODT) 4 mg Oral Tablet, Rapid Dissolve Patient states no longer taking    nitroGLYCERIN  (NITROSTAT ) 0.4 mg Sublingual Tablet, Sublingual Reorder       Assessment and Plan:  Assessment/Plan   1. NSVT (nonsustained ventricular tachycardia) (CMS HCC)    2.  PVCs (premature ventricular contractions)    3. Essential hypertension    4. Dyslipidemia    5. Morbid obesity with BMI of 45.0-49.9, adult (CMS [facility])        Dominique Becker appears to be doing well from a cardiac standpoint.  She has a history of coronary artery disease no anginal symptoms.  She has intermediate cardiac risk in his very concerned about having heart disease do a family history.  I have recommended she consider getting a coronary artery calcium score.  If she has evidence of coronary disease she will need more intense statin therapy and beta blocker if she can tolerate.  She will continue to monitor blood pressure at home and contact us  or her primary care provider if not adequately controlled.  She would benefit significantly from weight loss.  She did not tolerate Mounjaro.  We will refer to Dr. Army Modena for bariatric surgical consultation.  She also needs screened for sleep apnea.  She did not have a good experience with [facility] in the past and will discuss sleep apnea screening with the primary care doctor.  She was encouraged to engage in regular aerobic exercise.  She was asked to contact us  if there are any changes in her cardiac status.  She will follow up in 1 year or sooner if needed.      Thank you for allowing us  to participate in care of your patient.  If we can be of further assistance in her care at any time please let us  know.      Sincerely,     Morene LITTIE Miu, PA-C      Follow up:  Return in about 1 year (around 11/27/2024).        Morene Miu, PA-C  [facility]  Cardiology  [facility]    A portion of this documentation may have been generated using [facility] voice recognition software and may contain syntax/voice recognition errors.              [1]   Allergies  Allergen Reactions    Doxycycline     Vancomycin Hcl      Other reaction(s): itching    Metronidazole Nausea/ Vomiting     Other reaction(s): Nausea, Vomiting, Diarrhea    Morphine Nausea/ Vomiting   [2]   Current Outpatient Medications:     aspirin (ECOTRIN) 81 mg Oral Tablet, Delayed Release (E.C.), Take 1 Tablet (81 mg total) by mouth Once per  day as needed for Pain, Disp: , Rfl:     escitalopram oxalate (LEXAPRO) 5 mg Oral Tablet, Take 1 Tablet (5 mg total) by mouth Once a day, Disp: , Rfl:     multivitamin Oral Capsule, Take 1 Capsule by mouth Once a day, Disp: , Rfl:     nitroGLYCERIN  (NITROSTAT ) 0.4 mg Sublingual Tablet, Sublingual, Place 1 Tablet (0.4 mg total) under the tongue Every 5 minutes as needed for Chest pain for 3 doses over 15 minutes, Disp: 25 Tablet, Rfl: 3    omeprazole (PRILOSEC) 20 mg Oral Capsule, Delayed Release(E.C.), , Disp: , Rfl:     rosuvastatin  (CRESTOR ) 5 mg Oral Tablet, Take 1 Tablet (5 mg total) by mouth Every evening, Disp: 90 Tablet, Rfl: 4    SYNTHROID 175 mcg Oral Tablet, Take 1 Tablet (175 mcg total) by mouth, Disp: , Rfl:

## 2023-11-30 ENCOUNTER — Other Ambulatory Visit (INDEPENDENT_AMBULATORY_CARE_PROVIDER_SITE_OTHER): Payer: Self-pay | Admitting: Family Medicine

## 2023-11-30 DIAGNOSIS — E785 Hyperlipidemia, unspecified: Secondary | ICD-10-CM

## 2023-11-30 MED ORDER — ROSUVASTATIN 5 MG TABLET
5.0000 mg | ORAL_TABLET | Freq: Every evening | ORAL | 3 refills | Status: AC
Start: 2023-11-30 — End: ?

## 2024-04-30 ENCOUNTER — Ambulatory Visit

## 2024-04-30 ENCOUNTER — Other Ambulatory Visit (INDEPENDENT_AMBULATORY_CARE_PROVIDER_SITE_OTHER): Payer: Self-pay | Admitting: ORTHOPAEDIC SURGERY

## 2024-04-30 ENCOUNTER — Other Ambulatory Visit: Payer: Self-pay

## 2024-04-30 ENCOUNTER — Encounter (INDEPENDENT_AMBULATORY_CARE_PROVIDER_SITE_OTHER): Payer: Self-pay

## 2024-04-30 ENCOUNTER — Ambulatory Visit (INDEPENDENT_AMBULATORY_CARE_PROVIDER_SITE_OTHER)

## 2024-04-30 VITALS — Temp 98.0°F | Ht 64.0 in | Wt 245.0 lb

## 2024-04-30 DIAGNOSIS — M79642 Pain in left hand: Secondary | ICD-10-CM | POA: Insufficient documentation

## 2024-04-30 DIAGNOSIS — M19042 Primary osteoarthritis, left hand: Secondary | ICD-10-CM

## 2024-04-30 DIAGNOSIS — M65332 Trigger finger, left middle finger: Secondary | ICD-10-CM

## 2024-04-30 MED ORDER — METHYLPREDNISOLONE ACETATE 40 MG/ML SUSPENSION FOR INJECTION
10.0000 mg | INTRAMUSCULAR | Status: AC
  Administered 2024-04-30: 10 mg via INTRAMUSCULAR

## 2024-04-30 MED ORDER — BUPIVACAINE (PF) 0.25 % (2.5 MG/ML) INJECTION SOLUTION
0.2500 mL | Freq: Once | INTRAMUSCULAR | Status: AC
  Administered 2024-04-30: 0.3 mL

## 2024-04-30 MED ORDER — LIDOCAINE HCL 10 MG/ML (1 %) INJECTION SOLUTION
0.2500 mL | Freq: Once | INTRAMUSCULAR | Status: AC
  Administered 2024-04-30: 3 mg

## 2024-04-30 NOTE — Procedures (Signed)
 ORTHOPEDICS, [facility]  [address]  [place] [address]  Operated by [facility]  Procedure Note    Name: Dominique Becker MRN:  [id number]   Date: 04/30/2024 DOB:  1960-06-09 (64 y.o.)         20600 - ARTHROCENTESIS ASPIRATION/INJ, SMALL JOINT/BURSA (FINGERS/TOES); WO US  (AMB ONLY-PD)    Performed by: Blane Suzen SAUNDERS, APRN, CNP  Authorized by: Blane Suzen SAUNDERS, APRN, CNP    Time Out:     Immediately before the procedure, a time out was called:  Yes    Patient verified:  Yes    Procedure Verified:  Yes    Site Verified:  Yes    Other:  Left middle finger  Documentation:      The patient decided upon an injection as treatment for trigger finger of .left middle finger....  After was explained to them, and consents obtained, the hand was prepped sterilely with alcohol.  A 30 gauge needle was inserted in the A1 pulley of ..let middle finger..  A negative aspirate was obtained.  The finger was then injected with 1/4 cc of depo medrol , 1/4 cc of 1% plain lidocaine , and 1/4 cc of 0.5% Marcaine .  A sterile bandage was applied.  The patient was counseled to call or return here p.r.n. for problems with the injection      Suzen SAUNDERS Blane, APRN, CNP

## 2024-04-30 NOTE — Progress Notes (Signed)
 ORTHOPEDICS, [facility]  [address]  [place] [address]  Operated by [facility]    Name: Dominique Becker MRN:  [id number]   Date: 04/30/2024 DOB:  [birth year] (64 y.o.)            History of Present Illness:     Chief Complaint: Hand Pain (Left hand pain/) patient has been having increasing left middle finger pain over the last couple of weeks.  He has had mechanical locking of the middle finger at home.  No previous treatment.          Past Medical History:     Past Medical History:   Diagnosis Date    Bone fibrous dysplasia     Depression     Disorder of thyroid      Dyslipidemia     Essential hypertension 11/28/2023    GERD (gastroesophageal reflux disease)     Headache     History of skin cancer              Past Surgical History:   Procedure Laterality Date    FACIAL COSMETIC SURGERY      x25    HX BREAST BIOPSY      HX HYSTERECTOMY           Family Medical History:       Problem Relation (Age of Onset)    Aortic stenosis Father    Breast Cancer Mother    Cancer Mother    Heart Attack Father    Heart Disease Father    High Cholesterol Father            Social History     Tobacco Use    Smoking status: Never    Smokeless tobacco: Never   Substance Use Topics    Alcohol use: Never     Current Outpatient Medications   Medication Sig    aspirin (ECOTRIN) 81 mg Oral Tablet, Delayed Release (E.C.) Take 1 Tablet (81 mg total) by mouth Once per day as needed for Pain    escitalopram oxalate (LEXAPRO) 5 mg Oral Tablet Take 1 Tablet (5 mg total) by mouth Once a day    multivitamin Oral Capsule Take 1 Capsule by mouth Once a day    nitroGLYCERIN  (NITROSTAT ) 0.4 mg Sublingual Tablet, Sublingual Place 1 Tablet (0.4 mg total) under the tongue Every 5 minutes as needed for Chest pain for 3 doses over 15 minutes    omeprazole (PRILOSEC) 20 mg Oral Capsule, Delayed Release(E.C.)     rosuvastatin  (CRESTOR ) 5 mg Oral Tablet Take 1 Tablet (5 mg total) by mouth Every evening     SYNTHROID 175 mcg Oral Tablet Take 1 Tablet (175 mcg total) by mouth     Allergies[1]    Review of Systems:     General: Denies fevers or chills. Denies weight loss.  HEENT: Denies rhinorrhea, sore throat, dysphagia, otalgia  Cardio: Denies chest pain, edema, orthopnea  Resp: Denies SOB, cough, or sputum production  Abd: Denies abdominal pain, nausea, vomiting, diarrhea, hematochezia, or melena.   Skin: Denies rashes, lesions, ulcers, or bruising  Neuro: Denies Numbness, tingling, hemiplegia, dizziness, or syncope  Psych/Emotional:  Denies depression, anxiety, SI, or HI       Physical Examination:     Temp 36.7 C (98 F)   Ht 1.626 m (5' 4)   Wt 111 kg (245 lb)   BMI 42.05 kg/m         MUSCULOSKELETAL:  Left hand:  No obvious deformity or swelling.  She does have a significant pain with palpation over the A1 pulley of the left middle finger.  There is also a palpable cord that extends down into the palmar surface.  A some mild discoloration noted at the palmar aspect over the AP joint.  She is grossly neurovascular intact  palpable radial and ulnar pulses.  Cap refill less than 2 seconds.    GENERAL: Patient is in no acute distress. Awake, alert and oriented x 3.  HEENT: Head is normocephalic, atraumatic. Extraocular movements are intact  LUNGS: Respirations clear. Symmetrical rise and fall of chest wall with inspiration and expiration.  ABDOMEN: Soft, Non-distended.  NEUROLOGICAL: No sign of focal motor deficits. Sensate to light tough in all distal nerve distributions.  SKIN: Skin is warm and dry to touch. No sign of jaundice, rashes or other visible lesions.  PSYCHIATRIC: Patient is pleasant, cooperative and alert. Mood and affect are appropriate.          Radiology Results     Results for orders placed in visit on 04/30/24    XR HAND LEFT 2 VIEW (Preliminary)  This result has not been signed. Information might be incomplete.    Narrative  XR HAND LEFT 2 VIEW performed on Erminio Cy Cyphers on Apr 30, 2024   1:26  PM.    REASON FOR EXAM: Left hand pain .    2 views were obtained and submitted for interpretation.    FINDINGS:  AP lateral views of the left hand reveal no acute findings such  as fracture or dislocation.  There is some scattered degenerative changes  with no erosion seen.    Impression  Negative for acute injury such as fracture or dislocation.  Degenerative features noted.      Plan:       ICD-10-CM    1. 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        No follow-ups on file.    We discussed x-ray findings and clinical exam findings being most consistent with a trigger finger of the left middle finger.  We discussed conservative treatment options.    She decided upon a cortisone injection to the left middle finger today.    She is established and would like to follow up with Dr. Gorge.  I am agreeable with this    Follow-up in 8-12 weeks for re-evaluation.  Sooner if needed        Suzen JONELLE Crawley, APRN, CNP 04/30/2024, 13:58    Portions of this note may be dictated using voice recognition software or a dictation service. Variances in spelling and vocabulary are possible and unintentional. Not all errors are caught/corrected. Please notify the dino if any discrepancies are noted or if the meaning of any statement is not clear.              [1]   Allergies  Allergen Reactions    Doxycycline     Vancomycin Hcl      Other reaction(s): itching    Metronidazole Nausea/ Vomiting     Other reaction(s): Nausea, Vomiting, Diarrhea    Morphine Nausea/ Vomiting

## 2024-06-28 ENCOUNTER — Ambulatory Visit (INDEPENDENT_AMBULATORY_CARE_PROVIDER_SITE_OTHER): Payer: Self-pay | Admitting: ORTHOPAEDIC SURGERY

## 2024-11-27 ENCOUNTER — Encounter (INDEPENDENT_AMBULATORY_CARE_PROVIDER_SITE_OTHER): Payer: Self-pay
